# Patient Record
Sex: Male | Born: 1935
Health system: Southern US, Community
[De-identification: ages and names within clinical notes are randomized; demographics above are authoritative.]

---

## 2001-09-30 ENCOUNTER — Encounter: Payer: Self-pay | Admitting: Cardiology

## 2001-09-30 ENCOUNTER — Inpatient Hospital Stay (HOSPITAL_COMMUNITY): Admission: EM | Admit: 2001-09-30 | Discharge: 2001-10-01 | Payer: Self-pay | Admitting: Cardiology

## 2006-10-31 ENCOUNTER — Encounter: Admission: RE | Admit: 2006-10-31 | Discharge: 2006-10-31 | Payer: Self-pay | Admitting: Orthopedic Surgery

## 2007-01-15 ENCOUNTER — Inpatient Hospital Stay (HOSPITAL_COMMUNITY): Admission: RE | Admit: 2007-01-15 | Discharge: 2007-01-18 | Payer: Self-pay | Admitting: Orthopedic Surgery

## 2007-01-18 ENCOUNTER — Ambulatory Visit: Payer: Self-pay | Admitting: Vascular Surgery

## 2007-01-18 ENCOUNTER — Encounter (INDEPENDENT_AMBULATORY_CARE_PROVIDER_SITE_OTHER): Payer: Self-pay | Admitting: Orthopedic Surgery

## 2008-12-22 IMAGING — CR DG SHOULDER 1V*L*
1 series · 1 of 1 positions shown · non-contrast
Comparison: CT 10/31/06.

CLINICAL DATA: Nonunion proximal left humeral fracture.  
 PORTABLE LEFT SHOULDER ? _ VIEW:

[AP]
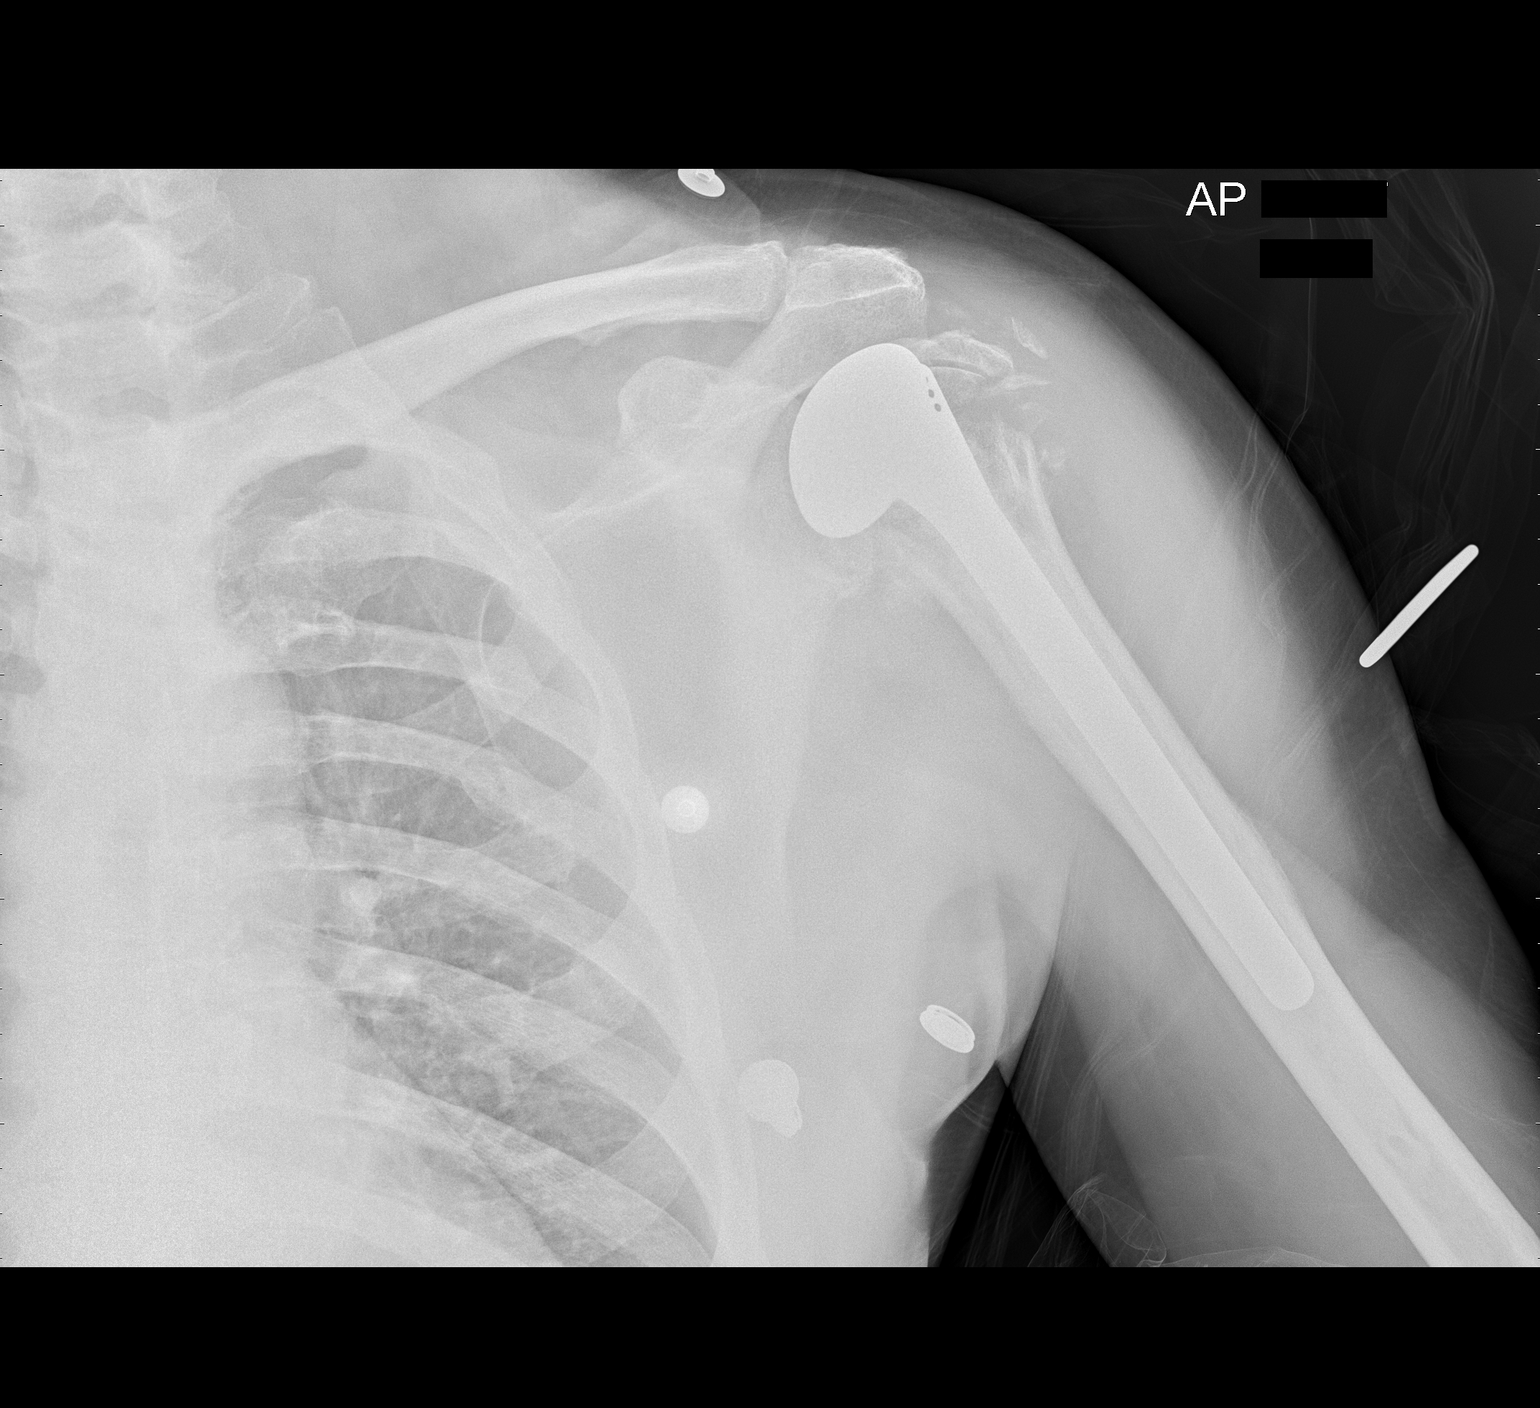

[1 of 1 positions shown; findings below may reference images not displayed]

FINDINGS: Status-post left shoulder replacement appearing in satisfactory position.  Surrounding bone fragments.  Mild acromioclavicular joint degenerative changes.
IMPRESSION: Status-post left shoulder replacement appearing in satisfactory position.

## 2010-03-20 ENCOUNTER — Encounter: Payer: Self-pay | Admitting: Orthopedic Surgery

## 2010-07-11 NOTE — Discharge Summary (Signed)
NAME:  Austin Peterson, CAMPAS NO.:  [id number]   MEDICAL RECORD NO.:  [id number]          PATIENT TYPE:  INP   LOCATION:  5020                         FACILITY:  [facility]   PHYSICIAN:  Almedia Balls. Ranell Patrick, M.D. DATE OF BIRTH:  May 03, 1926   DATE OF ADMISSION:  01/15/2007  DATE OF DISCHARGE:  01/18/2007                               DISCHARGE SUMMARY   ADMISSION DIAGNOSIS:  Left proximal humerus nonunion.   DISCHARGE DIAGNOSIS:  Left proximal humerus nonunion.   BRIEF HISTORY:  The patient is a 77 year old male who presented with a  history of a fall injuring his left shoulder with malunion of a proximal  humerus fracture.  The patient has elected to have Dr. Malon Kindle  complete an ORIF versus a hemiarthroplasty.   PROCEDURE:  The patient had a left shoulder hemiarthroplasty using the  [employer] FX system on January 15, 2007.  Attending surgeon was  Malon Kindle, MD; assistant was Standley Dakins, PA-C.  Estimated blood  loss was minimal.  No complications.   HOSPITAL COURSE:  The patient was admitted on January 15, 2007, for the  above-stated procedure which he tolerated well.  After adequate time in  the post anesthesia care unit he was transferred up to 5000.  Postoperative day #1 the patient complained about moderate pain to the  left shoulder but was able to complete activities with physical therapy  and pendulum exercises and some gentle range of motion.  Neurovascularly, he was intact.  Postoperative day #2 his dressing was  changed with some mild bleeding drainage that was stabilized after  dressing was changed.  Neurovascularly he was intact distally.  No signs  of edema.  Capillary refill was less than 2 seconds.  He was kept in a  sling.  The patient to be discharged to a short-term skilled nursing  facility on January 18, 2007.   ACTIVITY:  Nonweightbearing with left upper extremity and work with  gentle range of motion only in regards to passive range  of motion.   CONDITION:  Stable.   DIET:  Regular   The patient has allergies to CODEINE and also to STATINS.   DISCHARGE MEDICATIONS:  1. Colace 100 mg p.o. b.i.d.  2. Zetia 10 mg p.o. daily.  3. Claritin 10 mg p.o. daily.  4. Robaxin 500 mg p.o. q.6h.  5. Percocet 5 mg one to two tablets q.4-6h. pain.  6. Ambien 5 mg p.o. q.h.s.   FOLLOWUP:  The patient will follow back up with Dr. Malon Kindle in 7-  10 days and dressing change to the left shoulder for the next week and  then will see him as a followup patient in the clinic.      Thomas B. Durwin Nora, P.A.      Almedia Balls. Ranell Patrick, M.D.  Electronically Signed    TBD/MEDQ  D:  01/17/2007  T:  01/17/2007  Job:  [id number]

## 2010-07-11 NOTE — Op Note (Signed)
NAME:  MONTRAE, Austin Peterson NO.:  [id number]   MEDICAL RECORD NO.:  [id number]          PATIENT TYPE:  INP   LOCATION:  2550                         FACILITY:  [facility]   PHYSICIAN:  Almedia Balls. Ranell Patrick, M.D. DATE OF BIRTH:  1926-10-04   DATE OF PROCEDURE:  01/15/2007  DATE OF DISCHARGE:                               OPERATIVE REPORT   PREOPERATIVE DIAGNOSIS:  Left shoulder proximal humeral malunion.   POSTOPERATIVE DIAGNOSIS:  Left shoulder proximal humeral malunion.   PROCEDURE PERFORMED:  Left shoulder hemiarthroplasty and De[employer]uy [employer] FX  system.   ATTENDING SURGEON:  Almedia Balls. Ranell Patrick, M.D.   ASSISTANT:  Donnie Coffin. Durwin Nora, P.A.   General anesthesia plus interscalene block anesthesia was used.   ESTIMATED BLOOD LOSS:  Minimal.   FLUID REPLACEMENT:  1,500 mL crystalloid.   ESTIMATED BLOOD LOSS:  100 mL.   INSTRUMENT COUNTS:  Correct.   No complications.   Perioperative antibiotics were given.   INDICATION:  The patient is a 77 year old male with history of a fall  injuring his left shoulder.  The patient fell off a roof.  The patient  suffered a four part displaced proximal humerus fracture.  The patient  was treated conservatively in another community.  The patient presented  to orthopedics here in [place] with a severe malunion of the proximal  humerus with subsidence and posterior rotation of the humeral head and  tuberosities which were healed on top of the articular cartilage and  were essentially impinging in the subacromial space.  The patient had  terrible function with the following preoperative range of motion:  Forward elevation 45 degrees, abduction 30 degrees, external rotation 0  degrees, internal rotation 20 degrees.  We discussed this with the  patient.  The patient elected to proceed with surgical arthroplasty to  try to restore function and eliminate his pain.  Informed consent was  obtained.   DESCRIPTION OF THE OPERATION:  After an  adequate level of anesthesia was  achieved, the patient positioned in the modified beach-chair position.  All neurovascular structures padded appropriately.  The left shoulder  was sterilely prepped and draped in the usual manner.  We went ahead and  made a deltopectoral approach starting at the coracoid and extending  down to the anterior humeral insertion of the deltoid.  Dissection  carried sharply down through subcutaneous tissues.  We identified the  cephalic vein and took that laterally with the deltoid.  Cephalic vein  was injured during the approach and was ligated with suture ligature.  At this point, we went ahead and released a little bit of the upper  pectoralis.  This gave [place] excellent access to the conjoint tendon, which  we retracted medially.  We then identified a completely scarred interval  under the deltoid area so the deltoid had scarred completely down to the  lateral humerus to the rotator cuff.  Subscapularis actually was fairly  free of scarring.  We went ahead and released the biceps groove and  developed the left tuberosity and took a 1 inch curved osteotome and  went ahead and removed the  lesser tuberosity off of the proximal humerus  with the subscapularis.  We placed two #2 FiberWire sutures in a  mattress technique with the suture strands coming out over the top of  the lesser tuberosity.  We freed this all the way up in the rotator  interval to the scapular neck and freed it off the scapular neck off the  inferior coracoid such that it had good balance.  Next, we turned our  attention to the greater tuberosity.  We actually were able to find the  greater tuberosity healed on top of the humeral head and freed that up  off the humeral head around to the back.  We had to debulk that quite a  bit, placed a total of three #2 FiberWire sutures in a mattress fashion  lateral to the greater tuberosity with those strands coming out over the  top of the greater  tuberosity but these were full thickness bites.  We  next freed up and went ahead and removed the humeral head, which was  basically rotated 100 degrees posteriorly with curved osteotomes and  removed that.  We had to free the greater tuberosity up from underneath  the acromion and from under the deltoid and off the glenoid.  The biceps  was tenotomized and the superior labrum was debrided.  The articular  cartilage on the glenoid was normal.  We next prepared the shaft reaming  up to a size 12 reamer and then went and placed our 12 stem end with the  anterior pin adjacent to the biceps groove.  We played with the height  of that and really had to fully sink the stem down to get it the  appropriate height to where we could actually insert a 48 x 15 head.  We  trialed that, had excellent position of the head as it referenced the  glenoid.  I was able to translate it 50% posteriorly with a nice return  and then 50% inferiorly.  I went ahead and removed the trial implants.  We placed three drill holes in the humeral shaft, one in the floor of  the biceps groove and one medial and lateral to that and then brought  out the #2 FiberWire, a total of two of those with four strands through  those holes and then using DePuy 1 cement and vacuumizing technique,  went ahead and cemented the final prosthesis into place, fully seating  the prosthesis, allowing the cement to harden, then trialing with a 48 x  15 head and then placed that for real once we selected that size and  trialed it again.  We then went ahead and bone grafted using available  bone graft anteriorly and then brought our lesser and greater  tuberosities and tied those to each other.  Also took the shaft sutures  and placed those up through the subscapularis and through the rotator  cuff.  We also placed and around the world stitch medial to the left  tuberosity and lateral to the greater tuberosity.  It went through the  medial fin of  the prosthesis.  We had an excellent recreation of the  proximal humeral anatomy with tuberosity to tuberosity contact and  tuberosity to shaft contact and everything moving as a unit once we had  all the sutures tied.  We took the shoulder through a full range of  motion.  We had no significant soft tissue impingement or tightness and  excellent position of the arm on the  patient's body.  We went and  thoroughly irrigated, closed the deltopectoral approach with 0 Vicryl  suture, followed by 2-0 Vicryl subcutaneous closure and 4-0 Monocryl for  skin.  Steri-Strips applied followed by a sterile dressing and a  shoulder sling.  The patient tolerated the surgery well.      Almedia Balls. Ranell Patrick, M.D.  Electronically Signed     SRN/MEDQ  D:  01/15/2007  T:  01/15/2007  Job:  [id number]

## 2010-07-14 NOTE — Discharge Summary (Signed)
NAME:  Austin Peterson, Austin Peterson                         ACCOUNT NO.:  [id number]   MEDICAL RECORD NO.:  [id number]                   PATIENT TYPE:  INP   LOCATION:  3735                                 FACILITY:  [facility]   PHYSICIAN:  Joellyn Rued                        DATE OF BIRTH:  1927/01/18   DATE OF ADMISSION:  09/30/2001  DATE OF DISCHARGE:  10/01/2001                           DISCHARGE SUMMARY - REFERRING   HISTORY OF PRESENT ILLNESS:  The patient is a 77 year old white male who,  after cutting down approximately 25 trees, developed sudden onset of  substernal pushing pressure sensation while lying in bed watching T.V. that  evening.  He took three Tums without relief.  He felt much better by Monday  morning.  After walking from [employer] to his car, he had recurring  sensation associated with diaphoresis.   He was seen by his primary care doctor, Dr. Wende Crease, and was admitted  to [facility] where he ruled out for myocardial infarction.  His  history is notable for hyperlipidemia and family history.   ALLERGIES:  Intolerance to STATINS.   LABORATORY DATA:  At [facility], fasting lipid showed triglycerides  of 293, cholesterol of 271, LDL 162, HDL of 50.  CKs and troponins were  negative for myocardial infarction.  Sodium was 136.  Potassium 4.0, BUN 16,  creatinine 1.2.  H&H 13.8 and 38.4.  Platelets 167,000.  WBC 6.0.  At [facility], PT was 13.5.   HOSPITAL COURSE:  The patient was transferred down to [facility] on October 01, 2001.  He underwent cardiac catheterization by Dr.  Salvadore Farber.  According to his progress note, he had a very small RCA  with a proximal 70% lesion.  He also had a 20% proximal LAD and a 30%  proximal diagonal.  Ejection fraction was 71% without wall motion  abnormalities.  Postsheath removal and bed rest, he was ambulating without  difficulties.  Catheterization site was intact and it was  felt that he could  be discharged home on medical treatment.   DISCHARGE DIAGNOSES:  1. Unstable angina.  2. Coronary artery disease as previously described medical treatment.  3. Hyperlipidemia.   DISCHARGE MEDICATIONS:  1. He is discharged home on Plavix 75 mg q.d.  2. Coated aspirin 81 mg q.d.  3. Lopressor 50 mg 1/2 tablet b.i.d.  4. Naprosyn 500 mg b.i.d.  5. Nitroglycerin 0.4 mg p.r.n.   DISCHARGE INSTRUCTIONS:  He was advised no lifting, driving, sexual  activity, or heavy exertion for two days.  Maintain low salt, fat,  cholesterol diet.  If he has any problems with his catheterization site, he  will call.  He  will have a groin check with Dr. Gemma Payor P.A., Suszanne Conners. Duran,  in the [place] office on Wednesday, October 08, 2001 at 3:30 p.m.  At that time,  consideration should be given to re-evaluating his therapy for his  hyperlipidemia.  He will also arrange a follow-up appointment with Dr. Wende Crease.                                               Joellyn Rued    EW/MEDQ  D:  10/01/2001  T:  10/05/2001  Job:  [id number]   cc:   Luis Abed, M.D. [facility]  [address]  [address]  [place], [place] [address]   Wende Crease, M.D.

## 2010-07-14 NOTE — Cardiovascular Report (Signed)
   NAME:  Austin Peterson, CARNERO NO.:  [id number]   MEDICAL RECORD NO.:  [id number]                   PATIENT TYPE:  INP   LOCATION:  3735                                 FACILITY:  [facility]   PHYSICIAN:  Salvadore Farber, MD [facility]            DATE OF BIRTH:  October 10, 1926   DATE OF PROCEDURE:  10/01/2001  DATE OF DISCHARGE:                              CARDIAC CATHETERIZATION   PROCEDURES:  Coronary angiography, left ventriculography, left heart  catheterization.   INDICATIONS:  The patient is a 77 year old gentleman who presented with two  days of chest pain occurring at rest.  Electrocardiogram was normal. Cardiac  enzymes were normal. He is referred for diagnostic angiography.   DIAGNOSTIC PROCEDURE:  Under 2% lidocaine local anesthesia, a 6 French  sheath was placed in the right femoral artery using modified Seldinger  technique. The JL4 and __________ right catheters were used. A pigtail  catheter was advanced across the aortic valve into the left ventricle.  Pressures were measured. Ventriculography was performed by __________  injection. Following the procedure, the sheaths were removed.   COMPLICATIONS:  None.   FINDINGS:  1. Left ventricular pressure 134/6/14.  2. Left ventricular ejection fraction equals 71% without regional wall     motion abnormalities.  3. Left main:  Normal.  4. Circumflex:  Dominant vessel with three obtuse marginal branches and a     PDA. There is no significant disease.  5. Left anterior descending:  Moderate sized vessel with three small     diagonal branches. The proximal vessel has a long 20% stenosis.  6. Right coronary artery:  This is a small nondominant vessel.  There is an     ostial 70% stenosis with TIMI-3 flow.   IMPRESSION:  Probably acute coronary syndrome involving a small nondominant  right coronary artery. The vessel is well under 2 mm in diameter.   PLAN:  The culprit vessel is quite small and supplies a  small territory. We  will therefore plan medical therapy with aspirin, Plavix and nitrates. His  bradycardia precludes beta blockade.  Anticipate discharge to home this  afternoon.                                                 Salvadore Farber, MD [facility]    WED/MEDQ  D:  10/01/2001  T:  10/06/2001  Job:  [id number]   cc:   Jonelle Sidle, M.D. [facility]   Wende Crease, M.D.

## 2010-12-05 LAB — BASIC METABOLIC PANEL
CO2: 24
CO2: 25
Calcium: 8.5
Calcium: 9
Chloride: 102
Creatinine, Ser: 1.44
Creatinine, Ser: 1.49
GFR calc Af Amer: 55 — ABNORMAL LOW
GFR calc Af Amer: 58 — ABNORMAL LOW
GFR calc Af Amer: >60
GFR calc Af Amer: 54 — ABNORMAL LOW
GFR calc non Af Amer: 48 — ABNORMAL LOW
GFR calc non Af Amer: 51 — ABNORMAL LOW
GFR calc non Af Amer: 44 — ABNORMAL LOW
Glucose, Bld: 125 — ABNORMAL HIGH
Glucose, Bld: 177 — ABNORMAL HIGH
Potassium: 4.4
Sodium: 137
Sodium: 139

## 2010-12-05 LAB — TYPE AND SCREEN
ABO/RH(D): A POS
Antibody Screen: NEGATIVE

## 2010-12-05 LAB — DIFFERENTIAL
Basophils Absolute: 0
Basophils Relative: 0
Eosinophils Absolute: 0.1 — ABNORMAL LOW
Monocytes Absolute: 0.6
Monocytes Relative: 8
Neutro Abs: 4.3

## 2010-12-05 LAB — CBC
HCT: 42.8
Hemoglobin: 10.8 — ABNORMAL LOW
Hemoglobin: 15
MCHC: 34.6
MCHC: 34.8
MCV: 96.4
Platelets: 206
RBC: 3.23 — ABNORMAL LOW
RBC: 4.46
RDW: 13
RDW: 13.1
WBC: 7.6

## 2010-12-05 LAB — URINALYSIS, ROUTINE W REFLEX MICROSCOPIC
Hgb urine dipstick: NEGATIVE
Ketones, ur: 15 — AB
Nitrite: NEGATIVE
Specific Gravity, Urine: 1.03
pH: 5.5

## 2010-12-05 LAB — APTT: aPTT: 30

## 2010-12-05 LAB — PROTIME-INR: INR: 1.1

## 2011-05-23 DIAGNOSIS — K859 Acute pancreatitis without necrosis or infection, unspecified: Secondary | ICD-10-CM

## 2011-05-23 DIAGNOSIS — Z01810 Encounter for preprocedural cardiovascular examination: Secondary | ICD-10-CM

## 2014-09-01 ENCOUNTER — Telehealth (HOSPITAL_COMMUNITY): Payer: Self-pay | Admitting: *Deleted

## 2014-11-02 ENCOUNTER — Ambulatory Visit (HOSPITAL_COMMUNITY): Payer: Self-pay | Admitting: Psychiatry

## 2015-03-10 DIAGNOSIS — F419 Anxiety disorder, unspecified: Secondary | ICD-10-CM | POA: Diagnosis not present

## 2015-03-10 DIAGNOSIS — M545 Low back pain: Secondary | ICD-10-CM | POA: Diagnosis not present

## 2015-03-10 DIAGNOSIS — G309 Alzheimer's disease, unspecified: Secondary | ICD-10-CM | POA: Diagnosis not present

## 2015-03-10 DIAGNOSIS — J441 Chronic obstructive pulmonary disease with (acute) exacerbation: Secondary | ICD-10-CM | POA: Diagnosis not present

## 2015-04-11 DIAGNOSIS — M545 Low back pain: Secondary | ICD-10-CM | POA: Diagnosis not present

## 2015-04-11 DIAGNOSIS — J441 Chronic obstructive pulmonary disease with (acute) exacerbation: Secondary | ICD-10-CM | POA: Diagnosis not present

## 2015-04-21 DIAGNOSIS — N400 Enlarged prostate without lower urinary tract symptoms: Secondary | ICD-10-CM | POA: Diagnosis not present

## 2015-04-21 DIAGNOSIS — R300 Dysuria: Secondary | ICD-10-CM | POA: Diagnosis not present

## 2015-04-21 DIAGNOSIS — R350 Frequency of micturition: Secondary | ICD-10-CM | POA: Diagnosis not present

## 2015-05-13 DIAGNOSIS — Z710 Person encountering health services to consult on behalf of another person: Secondary | ICD-10-CM | POA: Diagnosis not present

## 2015-05-13 DIAGNOSIS — G309 Alzheimer's disease, unspecified: Secondary | ICD-10-CM | POA: Diagnosis not present

## 2015-05-16 DIAGNOSIS — E119 Type 2 diabetes mellitus without complications: Secondary | ICD-10-CM | POA: Diagnosis not present

## 2015-05-16 DIAGNOSIS — I10 Essential (primary) hypertension: Secondary | ICD-10-CM | POA: Diagnosis not present

## 2015-05-25 DIAGNOSIS — E1142 Type 2 diabetes mellitus with diabetic polyneuropathy: Secondary | ICD-10-CM | POA: Diagnosis not present

## 2015-05-25 DIAGNOSIS — I509 Heart failure, unspecified: Secondary | ICD-10-CM | POA: Diagnosis not present

## 2015-05-25 DIAGNOSIS — J441 Chronic obstructive pulmonary disease with (acute) exacerbation: Secondary | ICD-10-CM | POA: Diagnosis not present

## 2015-05-25 DIAGNOSIS — G309 Alzheimer's disease, unspecified: Secondary | ICD-10-CM | POA: Diagnosis not present

## 2015-05-25 DIAGNOSIS — Z87891 Personal history of nicotine dependence: Secondary | ICD-10-CM | POA: Diagnosis not present

## 2015-06-13 DIAGNOSIS — I509 Heart failure, unspecified: Secondary | ICD-10-CM | POA: Diagnosis not present

## 2015-06-13 DIAGNOSIS — M545 Low back pain: Secondary | ICD-10-CM | POA: Diagnosis not present

## 2015-06-13 DIAGNOSIS — G309 Alzheimer's disease, unspecified: Secondary | ICD-10-CM | POA: Diagnosis not present

## 2015-06-13 DIAGNOSIS — J441 Chronic obstructive pulmonary disease with (acute) exacerbation: Secondary | ICD-10-CM | POA: Diagnosis not present

## 2015-07-15 DIAGNOSIS — Z6828 Body mass index (BMI) 28.0-28.9, adult: Secondary | ICD-10-CM | POA: Diagnosis not present

## 2015-07-15 DIAGNOSIS — J441 Chronic obstructive pulmonary disease with (acute) exacerbation: Secondary | ICD-10-CM | POA: Diagnosis not present

## 2015-07-15 DIAGNOSIS — M545 Low back pain: Secondary | ICD-10-CM | POA: Diagnosis not present

## 2015-07-15 DIAGNOSIS — I509 Heart failure, unspecified: Secondary | ICD-10-CM | POA: Diagnosis not present

## 2015-07-15 DIAGNOSIS — G309 Alzheimer's disease, unspecified: Secondary | ICD-10-CM | POA: Diagnosis not present

## 2015-08-01 DIAGNOSIS — I10 Essential (primary) hypertension: Secondary | ICD-10-CM | POA: Diagnosis not present

## 2015-08-01 DIAGNOSIS — E119 Type 2 diabetes mellitus without complications: Secondary | ICD-10-CM | POA: Diagnosis not present

## 2015-08-11 DIAGNOSIS — J449 Chronic obstructive pulmonary disease, unspecified: Secondary | ICD-10-CM | POA: Diagnosis not present

## 2015-08-11 DIAGNOSIS — M545 Low back pain: Secondary | ICD-10-CM | POA: Diagnosis not present

## 2015-08-11 DIAGNOSIS — F419 Anxiety disorder, unspecified: Secondary | ICD-10-CM | POA: Diagnosis not present

## 2015-08-11 DIAGNOSIS — E1142 Type 2 diabetes mellitus with diabetic polyneuropathy: Secondary | ICD-10-CM | POA: Diagnosis not present

## 2015-09-15 DIAGNOSIS — M545 Low back pain: Secondary | ICD-10-CM | POA: Diagnosis not present

## 2015-09-15 DIAGNOSIS — I509 Heart failure, unspecified: Secondary | ICD-10-CM | POA: Diagnosis not present

## 2015-09-15 DIAGNOSIS — I2510 Atherosclerotic heart disease of native coronary artery without angina pectoris: Secondary | ICD-10-CM | POA: Diagnosis not present

## 2015-09-15 DIAGNOSIS — E1165 Type 2 diabetes mellitus with hyperglycemia: Secondary | ICD-10-CM | POA: Diagnosis not present

## 2015-09-15 DIAGNOSIS — Z299 Encounter for prophylactic measures, unspecified: Secondary | ICD-10-CM | POA: Diagnosis not present

## 2015-10-13 DIAGNOSIS — Z299 Encounter for prophylactic measures, unspecified: Secondary | ICD-10-CM | POA: Diagnosis not present

## 2015-10-13 DIAGNOSIS — L97504 Non-pressure chronic ulcer of other part of unspecified foot with necrosis of bone: Secondary | ICD-10-CM | POA: Diagnosis not present

## 2015-10-13 DIAGNOSIS — G309 Alzheimer's disease, unspecified: Secondary | ICD-10-CM | POA: Diagnosis not present

## 2015-10-13 DIAGNOSIS — E11621 Type 2 diabetes mellitus with foot ulcer: Secondary | ICD-10-CM | POA: Diagnosis not present

## 2015-10-13 DIAGNOSIS — M549 Dorsalgia, unspecified: Secondary | ICD-10-CM | POA: Diagnosis not present

## 2015-10-18 DIAGNOSIS — I10 Essential (primary) hypertension: Secondary | ICD-10-CM | POA: Diagnosis not present

## 2015-10-18 DIAGNOSIS — E7800 Pure hypercholesterolemia, unspecified: Secondary | ICD-10-CM | POA: Diagnosis not present

## 2015-10-18 DIAGNOSIS — E11621 Type 2 diabetes mellitus with foot ulcer: Secondary | ICD-10-CM | POA: Diagnosis not present

## 2015-10-18 DIAGNOSIS — L89620 Pressure ulcer of left heel, unstageable: Secondary | ICD-10-CM | POA: Diagnosis not present

## 2015-10-18 DIAGNOSIS — Z79899 Other long term (current) drug therapy: Secondary | ICD-10-CM | POA: Diagnosis not present

## 2015-10-18 DIAGNOSIS — Z794 Long term (current) use of insulin: Secondary | ICD-10-CM | POA: Diagnosis not present

## 2015-10-18 DIAGNOSIS — Z7982 Long term (current) use of aspirin: Secondary | ICD-10-CM | POA: Diagnosis not present

## 2015-10-18 DIAGNOSIS — L97429 Non-pressure chronic ulcer of left heel and midfoot with unspecified severity: Secondary | ICD-10-CM | POA: Diagnosis not present

## 2015-10-27 DIAGNOSIS — Z7982 Long term (current) use of aspirin: Secondary | ICD-10-CM | POA: Diagnosis not present

## 2015-10-27 DIAGNOSIS — Z885 Allergy status to narcotic agent status: Secondary | ICD-10-CM | POA: Diagnosis not present

## 2015-10-27 DIAGNOSIS — Z79899 Other long term (current) drug therapy: Secondary | ICD-10-CM | POA: Diagnosis not present

## 2015-10-27 DIAGNOSIS — Z794 Long term (current) use of insulin: Secondary | ICD-10-CM | POA: Diagnosis not present

## 2015-10-27 DIAGNOSIS — K219 Gastro-esophageal reflux disease without esophagitis: Secondary | ICD-10-CM | POA: Diagnosis not present

## 2015-10-27 DIAGNOSIS — L97422 Non-pressure chronic ulcer of left heel and midfoot with fat layer exposed: Secondary | ICD-10-CM | POA: Diagnosis not present

## 2015-10-27 DIAGNOSIS — Z888 Allergy status to other drugs, medicaments and biological substances status: Secondary | ICD-10-CM | POA: Diagnosis not present

## 2015-10-27 DIAGNOSIS — E119 Type 2 diabetes mellitus without complications: Secondary | ICD-10-CM | POA: Diagnosis not present

## 2015-10-27 DIAGNOSIS — L97429 Non-pressure chronic ulcer of left heel and midfoot with unspecified severity: Secondary | ICD-10-CM | POA: Diagnosis not present

## 2015-10-27 DIAGNOSIS — S90822A Blister (nonthermal), left foot, initial encounter: Secondary | ICD-10-CM | POA: Diagnosis not present

## 2015-10-27 DIAGNOSIS — E7800 Pure hypercholesterolemia, unspecified: Secondary | ICD-10-CM | POA: Diagnosis not present

## 2015-10-27 DIAGNOSIS — F0390 Unspecified dementia without behavioral disturbance: Secondary | ICD-10-CM | POA: Diagnosis not present

## 2015-11-03 DIAGNOSIS — E119 Type 2 diabetes mellitus without complications: Secondary | ICD-10-CM | POA: Diagnosis not present

## 2015-11-03 DIAGNOSIS — S90822A Blister (nonthermal), left foot, initial encounter: Secondary | ICD-10-CM | POA: Diagnosis not present

## 2015-11-03 DIAGNOSIS — L97422 Non-pressure chronic ulcer of left heel and midfoot with fat layer exposed: Secondary | ICD-10-CM | POA: Diagnosis not present

## 2015-11-03 DIAGNOSIS — S90822D Blister (nonthermal), left foot, subsequent encounter: Secondary | ICD-10-CM | POA: Diagnosis not present

## 2015-11-16 DIAGNOSIS — S90822A Blister (nonthermal), left foot, initial encounter: Secondary | ICD-10-CM | POA: Diagnosis not present

## 2015-11-16 DIAGNOSIS — M549 Dorsalgia, unspecified: Secondary | ICD-10-CM | POA: Diagnosis not present

## 2015-11-16 DIAGNOSIS — E119 Type 2 diabetes mellitus without complications: Secondary | ICD-10-CM | POA: Diagnosis not present

## 2015-11-16 DIAGNOSIS — I509 Heart failure, unspecified: Secondary | ICD-10-CM | POA: Diagnosis not present

## 2015-11-16 DIAGNOSIS — G309 Alzheimer's disease, unspecified: Secondary | ICD-10-CM | POA: Diagnosis not present

## 2015-11-16 DIAGNOSIS — S90822D Blister (nonthermal), left foot, subsequent encounter: Secondary | ICD-10-CM | POA: Diagnosis not present

## 2015-11-16 DIAGNOSIS — L97422 Non-pressure chronic ulcer of left heel and midfoot with fat layer exposed: Secondary | ICD-10-CM | POA: Diagnosis not present

## 2015-11-16 DIAGNOSIS — J441 Chronic obstructive pulmonary disease with (acute) exacerbation: Secondary | ICD-10-CM | POA: Diagnosis not present

## 2015-12-08 DIAGNOSIS — N400 Enlarged prostate without lower urinary tract symptoms: Secondary | ICD-10-CM | POA: Diagnosis not present

## 2015-12-08 DIAGNOSIS — Z885 Allergy status to narcotic agent status: Secondary | ICD-10-CM | POA: Diagnosis not present

## 2015-12-08 DIAGNOSIS — F0390 Unspecified dementia without behavioral disturbance: Secondary | ICD-10-CM | POA: Diagnosis not present

## 2015-12-08 DIAGNOSIS — E785 Hyperlipidemia, unspecified: Secondary | ICD-10-CM | POA: Diagnosis not present

## 2015-12-08 DIAGNOSIS — R55 Syncope and collapse: Secondary | ICD-10-CM | POA: Diagnosis not present

## 2015-12-08 DIAGNOSIS — I10 Essential (primary) hypertension: Secondary | ICD-10-CM | POA: Diagnosis not present

## 2015-12-08 DIAGNOSIS — Z7982 Long term (current) use of aspirin: Secondary | ICD-10-CM | POA: Diagnosis not present

## 2015-12-08 DIAGNOSIS — M545 Low back pain: Secondary | ICD-10-CM | POA: Diagnosis not present

## 2015-12-08 DIAGNOSIS — R42 Dizziness and giddiness: Secondary | ICD-10-CM | POA: Diagnosis not present

## 2015-12-08 DIAGNOSIS — Z720 Tobacco use: Secondary | ICD-10-CM | POA: Diagnosis not present

## 2015-12-08 DIAGNOSIS — M79643 Pain in unspecified hand: Secondary | ICD-10-CM | POA: Diagnosis not present

## 2015-12-08 DIAGNOSIS — Z888 Allergy status to other drugs, medicaments and biological substances status: Secondary | ICD-10-CM | POA: Diagnosis not present

## 2015-12-08 DIAGNOSIS — W19XXXA Unspecified fall, initial encounter: Secondary | ICD-10-CM | POA: Diagnosis not present

## 2015-12-08 DIAGNOSIS — S0990XA Unspecified injury of head, initial encounter: Secondary | ICD-10-CM | POA: Diagnosis not present

## 2015-12-08 DIAGNOSIS — M25552 Pain in left hip: Secondary | ICD-10-CM | POA: Diagnosis not present

## 2015-12-08 DIAGNOSIS — Z794 Long term (current) use of insulin: Secondary | ICD-10-CM | POA: Diagnosis not present

## 2015-12-08 DIAGNOSIS — E119 Type 2 diabetes mellitus without complications: Secondary | ICD-10-CM | POA: Diagnosis not present

## 2015-12-08 DIAGNOSIS — T148XXA Other injury of unspecified body region, initial encounter: Secondary | ICD-10-CM | POA: Diagnosis not present

## 2015-12-08 DIAGNOSIS — I951 Orthostatic hypotension: Secondary | ICD-10-CM | POA: Diagnosis not present

## 2015-12-08 DIAGNOSIS — F419 Anxiety disorder, unspecified: Secondary | ICD-10-CM | POA: Diagnosis not present

## 2015-12-08 DIAGNOSIS — Z79899 Other long term (current) drug therapy: Secondary | ICD-10-CM | POA: Diagnosis not present

## 2015-12-08 DIAGNOSIS — S199XXA Unspecified injury of neck, initial encounter: Secondary | ICD-10-CM | POA: Diagnosis not present

## 2015-12-09 DIAGNOSIS — R55 Syncope and collapse: Secondary | ICD-10-CM | POA: Diagnosis not present

## 2015-12-09 DIAGNOSIS — N400 Enlarged prostate without lower urinary tract symptoms: Secondary | ICD-10-CM | POA: Diagnosis not present

## 2015-12-09 DIAGNOSIS — E119 Type 2 diabetes mellitus without complications: Secondary | ICD-10-CM | POA: Diagnosis not present

## 2015-12-09 DIAGNOSIS — F0390 Unspecified dementia without behavioral disturbance: Secondary | ICD-10-CM | POA: Diagnosis not present

## 2015-12-10 DIAGNOSIS — F419 Anxiety disorder, unspecified: Secondary | ICD-10-CM | POA: Diagnosis not present

## 2015-12-10 DIAGNOSIS — N400 Enlarged prostate without lower urinary tract symptoms: Secondary | ICD-10-CM | POA: Diagnosis not present

## 2015-12-10 DIAGNOSIS — J449 Chronic obstructive pulmonary disease, unspecified: Secondary | ICD-10-CM | POA: Diagnosis not present

## 2015-12-10 DIAGNOSIS — M47817 Spondylosis without myelopathy or radiculopathy, lumbosacral region: Secondary | ICD-10-CM | POA: Diagnosis not present

## 2015-12-10 DIAGNOSIS — I10 Essential (primary) hypertension: Secondary | ICD-10-CM | POA: Diagnosis not present

## 2015-12-10 DIAGNOSIS — E785 Hyperlipidemia, unspecified: Secondary | ICD-10-CM | POA: Diagnosis not present

## 2015-12-10 DIAGNOSIS — L97522 Non-pressure chronic ulcer of other part of left foot with fat layer exposed: Secondary | ICD-10-CM | POA: Diagnosis not present

## 2015-12-10 DIAGNOSIS — F17210 Nicotine dependence, cigarettes, uncomplicated: Secondary | ICD-10-CM | POA: Diagnosis not present

## 2015-12-10 DIAGNOSIS — I951 Orthostatic hypotension: Secondary | ICD-10-CM | POA: Diagnosis not present

## 2015-12-10 DIAGNOSIS — E11621 Type 2 diabetes mellitus with foot ulcer: Secondary | ICD-10-CM | POA: Diagnosis not present

## 2015-12-10 DIAGNOSIS — F429 Obsessive-compulsive disorder, unspecified: Secondary | ICD-10-CM | POA: Diagnosis not present

## 2015-12-10 DIAGNOSIS — E1165 Type 2 diabetes mellitus with hyperglycemia: Secondary | ICD-10-CM | POA: Diagnosis not present

## 2015-12-10 DIAGNOSIS — F0390 Unspecified dementia without behavioral disturbance: Secondary | ICD-10-CM | POA: Diagnosis not present

## 2015-12-12 DIAGNOSIS — L97522 Non-pressure chronic ulcer of other part of left foot with fat layer exposed: Secondary | ICD-10-CM | POA: Diagnosis not present

## 2015-12-12 DIAGNOSIS — I951 Orthostatic hypotension: Secondary | ICD-10-CM | POA: Diagnosis not present

## 2015-12-12 DIAGNOSIS — J449 Chronic obstructive pulmonary disease, unspecified: Secondary | ICD-10-CM | POA: Diagnosis not present

## 2015-12-12 DIAGNOSIS — E1165 Type 2 diabetes mellitus with hyperglycemia: Secondary | ICD-10-CM | POA: Diagnosis not present

## 2015-12-12 DIAGNOSIS — E11621 Type 2 diabetes mellitus with foot ulcer: Secondary | ICD-10-CM | POA: Diagnosis not present

## 2015-12-12 DIAGNOSIS — I10 Essential (primary) hypertension: Secondary | ICD-10-CM | POA: Diagnosis not present

## 2015-12-14 DIAGNOSIS — J449 Chronic obstructive pulmonary disease, unspecified: Secondary | ICD-10-CM | POA: Diagnosis not present

## 2015-12-14 DIAGNOSIS — L97522 Non-pressure chronic ulcer of other part of left foot with fat layer exposed: Secondary | ICD-10-CM | POA: Diagnosis not present

## 2015-12-14 DIAGNOSIS — E11621 Type 2 diabetes mellitus with foot ulcer: Secondary | ICD-10-CM | POA: Diagnosis not present

## 2015-12-14 DIAGNOSIS — E1165 Type 2 diabetes mellitus with hyperglycemia: Secondary | ICD-10-CM | POA: Diagnosis not present

## 2015-12-14 DIAGNOSIS — I10 Essential (primary) hypertension: Secondary | ICD-10-CM | POA: Diagnosis not present

## 2015-12-14 DIAGNOSIS — I951 Orthostatic hypotension: Secondary | ICD-10-CM | POA: Diagnosis not present

## 2015-12-16 DIAGNOSIS — L97522 Non-pressure chronic ulcer of other part of left foot with fat layer exposed: Secondary | ICD-10-CM | POA: Diagnosis not present

## 2015-12-16 DIAGNOSIS — E1165 Type 2 diabetes mellitus with hyperglycemia: Secondary | ICD-10-CM | POA: Diagnosis not present

## 2015-12-16 DIAGNOSIS — J449 Chronic obstructive pulmonary disease, unspecified: Secondary | ICD-10-CM | POA: Diagnosis not present

## 2015-12-16 DIAGNOSIS — I951 Orthostatic hypotension: Secondary | ICD-10-CM | POA: Diagnosis not present

## 2015-12-16 DIAGNOSIS — E11621 Type 2 diabetes mellitus with foot ulcer: Secondary | ICD-10-CM | POA: Diagnosis not present

## 2015-12-16 DIAGNOSIS — I10 Essential (primary) hypertension: Secondary | ICD-10-CM | POA: Diagnosis not present

## 2015-12-19 DIAGNOSIS — I951 Orthostatic hypotension: Secondary | ICD-10-CM | POA: Diagnosis not present

## 2015-12-19 DIAGNOSIS — J449 Chronic obstructive pulmonary disease, unspecified: Secondary | ICD-10-CM | POA: Diagnosis not present

## 2015-12-19 DIAGNOSIS — E1165 Type 2 diabetes mellitus with hyperglycemia: Secondary | ICD-10-CM | POA: Diagnosis not present

## 2015-12-19 DIAGNOSIS — L97522 Non-pressure chronic ulcer of other part of left foot with fat layer exposed: Secondary | ICD-10-CM | POA: Diagnosis not present

## 2015-12-19 DIAGNOSIS — I10 Essential (primary) hypertension: Secondary | ICD-10-CM | POA: Diagnosis not present

## 2015-12-19 DIAGNOSIS — E11621 Type 2 diabetes mellitus with foot ulcer: Secondary | ICD-10-CM | POA: Diagnosis not present

## 2015-12-20 DIAGNOSIS — E11621 Type 2 diabetes mellitus with foot ulcer: Secondary | ICD-10-CM | POA: Diagnosis not present

## 2015-12-20 DIAGNOSIS — Z0000 Encounter for general adult medical examination without abnormal findings: Secondary | ICD-10-CM | POA: Diagnosis not present

## 2015-12-20 DIAGNOSIS — Z7189 Other specified counseling: Secondary | ICD-10-CM | POA: Diagnosis not present

## 2015-12-20 DIAGNOSIS — Z299 Encounter for prophylactic measures, unspecified: Secondary | ICD-10-CM | POA: Diagnosis not present

## 2015-12-20 DIAGNOSIS — R5383 Other fatigue: Secondary | ICD-10-CM | POA: Diagnosis not present

## 2015-12-20 DIAGNOSIS — E7800 Pure hypercholesterolemia, unspecified: Secondary | ICD-10-CM | POA: Diagnosis not present

## 2015-12-20 DIAGNOSIS — M545 Low back pain: Secondary | ICD-10-CM | POA: Diagnosis not present

## 2015-12-20 DIAGNOSIS — I10 Essential (primary) hypertension: Secondary | ICD-10-CM | POA: Diagnosis not present

## 2015-12-20 DIAGNOSIS — Z1389 Encounter for screening for other disorder: Secondary | ICD-10-CM | POA: Diagnosis not present

## 2015-12-20 DIAGNOSIS — L97504 Non-pressure chronic ulcer of other part of unspecified foot with necrosis of bone: Secondary | ICD-10-CM | POA: Diagnosis not present

## 2015-12-20 DIAGNOSIS — Z1211 Encounter for screening for malignant neoplasm of colon: Secondary | ICD-10-CM | POA: Diagnosis not present

## 2015-12-21 DIAGNOSIS — L97522 Non-pressure chronic ulcer of other part of left foot with fat layer exposed: Secondary | ICD-10-CM | POA: Diagnosis not present

## 2015-12-21 DIAGNOSIS — J449 Chronic obstructive pulmonary disease, unspecified: Secondary | ICD-10-CM | POA: Diagnosis not present

## 2015-12-21 DIAGNOSIS — E11621 Type 2 diabetes mellitus with foot ulcer: Secondary | ICD-10-CM | POA: Diagnosis not present

## 2015-12-21 DIAGNOSIS — E1165 Type 2 diabetes mellitus with hyperglycemia: Secondary | ICD-10-CM | POA: Diagnosis not present

## 2015-12-21 DIAGNOSIS — I10 Essential (primary) hypertension: Secondary | ICD-10-CM | POA: Diagnosis not present

## 2015-12-21 DIAGNOSIS — I951 Orthostatic hypotension: Secondary | ICD-10-CM | POA: Diagnosis not present

## 2015-12-23 DIAGNOSIS — I951 Orthostatic hypotension: Secondary | ICD-10-CM | POA: Diagnosis not present

## 2015-12-23 DIAGNOSIS — L97522 Non-pressure chronic ulcer of other part of left foot with fat layer exposed: Secondary | ICD-10-CM | POA: Diagnosis not present

## 2015-12-23 DIAGNOSIS — E11621 Type 2 diabetes mellitus with foot ulcer: Secondary | ICD-10-CM | POA: Diagnosis not present

## 2015-12-23 DIAGNOSIS — I10 Essential (primary) hypertension: Secondary | ICD-10-CM | POA: Diagnosis not present

## 2015-12-23 DIAGNOSIS — E1165 Type 2 diabetes mellitus with hyperglycemia: Secondary | ICD-10-CM | POA: Diagnosis not present

## 2015-12-23 DIAGNOSIS — J449 Chronic obstructive pulmonary disease, unspecified: Secondary | ICD-10-CM | POA: Diagnosis not present

## 2015-12-26 DIAGNOSIS — E1165 Type 2 diabetes mellitus with hyperglycemia: Secondary | ICD-10-CM | POA: Diagnosis not present

## 2015-12-26 DIAGNOSIS — J449 Chronic obstructive pulmonary disease, unspecified: Secondary | ICD-10-CM | POA: Diagnosis not present

## 2015-12-26 DIAGNOSIS — L97522 Non-pressure chronic ulcer of other part of left foot with fat layer exposed: Secondary | ICD-10-CM | POA: Diagnosis not present

## 2015-12-26 DIAGNOSIS — I10 Essential (primary) hypertension: Secondary | ICD-10-CM | POA: Diagnosis not present

## 2015-12-26 DIAGNOSIS — E11621 Type 2 diabetes mellitus with foot ulcer: Secondary | ICD-10-CM | POA: Diagnosis not present

## 2015-12-26 DIAGNOSIS — I951 Orthostatic hypotension: Secondary | ICD-10-CM | POA: Diagnosis not present

## 2015-12-28 DIAGNOSIS — E1165 Type 2 diabetes mellitus with hyperglycemia: Secondary | ICD-10-CM | POA: Diagnosis not present

## 2015-12-28 DIAGNOSIS — E11621 Type 2 diabetes mellitus with foot ulcer: Secondary | ICD-10-CM | POA: Diagnosis not present

## 2015-12-28 DIAGNOSIS — J449 Chronic obstructive pulmonary disease, unspecified: Secondary | ICD-10-CM | POA: Diagnosis not present

## 2015-12-28 DIAGNOSIS — L97522 Non-pressure chronic ulcer of other part of left foot with fat layer exposed: Secondary | ICD-10-CM | POA: Diagnosis not present

## 2015-12-28 DIAGNOSIS — I951 Orthostatic hypotension: Secondary | ICD-10-CM | POA: Diagnosis not present

## 2015-12-28 DIAGNOSIS — I10 Essential (primary) hypertension: Secondary | ICD-10-CM | POA: Diagnosis not present

## 2015-12-29 DIAGNOSIS — J449 Chronic obstructive pulmonary disease, unspecified: Secondary | ICD-10-CM | POA: Diagnosis not present

## 2015-12-29 DIAGNOSIS — L97522 Non-pressure chronic ulcer of other part of left foot with fat layer exposed: Secondary | ICD-10-CM | POA: Diagnosis not present

## 2015-12-29 DIAGNOSIS — E1165 Type 2 diabetes mellitus with hyperglycemia: Secondary | ICD-10-CM | POA: Diagnosis not present

## 2015-12-29 DIAGNOSIS — I951 Orthostatic hypotension: Secondary | ICD-10-CM | POA: Diagnosis not present

## 2015-12-29 DIAGNOSIS — E11621 Type 2 diabetes mellitus with foot ulcer: Secondary | ICD-10-CM | POA: Diagnosis not present

## 2015-12-29 DIAGNOSIS — I10 Essential (primary) hypertension: Secondary | ICD-10-CM | POA: Diagnosis not present

## 2015-12-30 DIAGNOSIS — E11621 Type 2 diabetes mellitus with foot ulcer: Secondary | ICD-10-CM | POA: Diagnosis not present

## 2015-12-30 DIAGNOSIS — I10 Essential (primary) hypertension: Secondary | ICD-10-CM | POA: Diagnosis not present

## 2015-12-30 DIAGNOSIS — I951 Orthostatic hypotension: Secondary | ICD-10-CM | POA: Diagnosis not present

## 2015-12-30 DIAGNOSIS — J449 Chronic obstructive pulmonary disease, unspecified: Secondary | ICD-10-CM | POA: Diagnosis not present

## 2015-12-30 DIAGNOSIS — E1165 Type 2 diabetes mellitus with hyperglycemia: Secondary | ICD-10-CM | POA: Diagnosis not present

## 2015-12-30 DIAGNOSIS — L97522 Non-pressure chronic ulcer of other part of left foot with fat layer exposed: Secondary | ICD-10-CM | POA: Diagnosis not present

## 2016-01-02 DIAGNOSIS — L97522 Non-pressure chronic ulcer of other part of left foot with fat layer exposed: Secondary | ICD-10-CM | POA: Diagnosis not present

## 2016-01-02 DIAGNOSIS — I951 Orthostatic hypotension: Secondary | ICD-10-CM | POA: Diagnosis not present

## 2016-01-02 DIAGNOSIS — E1165 Type 2 diabetes mellitus with hyperglycemia: Secondary | ICD-10-CM | POA: Diagnosis not present

## 2016-01-02 DIAGNOSIS — J449 Chronic obstructive pulmonary disease, unspecified: Secondary | ICD-10-CM | POA: Diagnosis not present

## 2016-01-02 DIAGNOSIS — I10 Essential (primary) hypertension: Secondary | ICD-10-CM | POA: Diagnosis not present

## 2016-01-02 DIAGNOSIS — E11621 Type 2 diabetes mellitus with foot ulcer: Secondary | ICD-10-CM | POA: Diagnosis not present

## 2016-01-03 DIAGNOSIS — L97522 Non-pressure chronic ulcer of other part of left foot with fat layer exposed: Secondary | ICD-10-CM | POA: Diagnosis not present

## 2016-01-03 DIAGNOSIS — I951 Orthostatic hypotension: Secondary | ICD-10-CM | POA: Diagnosis not present

## 2016-01-03 DIAGNOSIS — I10 Essential (primary) hypertension: Secondary | ICD-10-CM | POA: Diagnosis not present

## 2016-01-03 DIAGNOSIS — E11621 Type 2 diabetes mellitus with foot ulcer: Secondary | ICD-10-CM | POA: Diagnosis not present

## 2016-01-03 DIAGNOSIS — J449 Chronic obstructive pulmonary disease, unspecified: Secondary | ICD-10-CM | POA: Diagnosis not present

## 2016-01-03 DIAGNOSIS — E1165 Type 2 diabetes mellitus with hyperglycemia: Secondary | ICD-10-CM | POA: Diagnosis not present

## 2016-01-05 DIAGNOSIS — J449 Chronic obstructive pulmonary disease, unspecified: Secondary | ICD-10-CM | POA: Diagnosis not present

## 2016-01-05 DIAGNOSIS — I951 Orthostatic hypotension: Secondary | ICD-10-CM | POA: Diagnosis not present

## 2016-01-05 DIAGNOSIS — E1165 Type 2 diabetes mellitus with hyperglycemia: Secondary | ICD-10-CM | POA: Diagnosis not present

## 2016-01-05 DIAGNOSIS — E11621 Type 2 diabetes mellitus with foot ulcer: Secondary | ICD-10-CM | POA: Diagnosis not present

## 2016-01-05 DIAGNOSIS — L97522 Non-pressure chronic ulcer of other part of left foot with fat layer exposed: Secondary | ICD-10-CM | POA: Diagnosis not present

## 2016-01-05 DIAGNOSIS — I10 Essential (primary) hypertension: Secondary | ICD-10-CM | POA: Diagnosis not present

## 2016-01-06 DIAGNOSIS — I10 Essential (primary) hypertension: Secondary | ICD-10-CM | POA: Diagnosis not present

## 2016-01-06 DIAGNOSIS — L97522 Non-pressure chronic ulcer of other part of left foot with fat layer exposed: Secondary | ICD-10-CM | POA: Diagnosis not present

## 2016-01-06 DIAGNOSIS — E11621 Type 2 diabetes mellitus with foot ulcer: Secondary | ICD-10-CM | POA: Diagnosis not present

## 2016-01-06 DIAGNOSIS — I951 Orthostatic hypotension: Secondary | ICD-10-CM | POA: Diagnosis not present

## 2016-01-06 DIAGNOSIS — J449 Chronic obstructive pulmonary disease, unspecified: Secondary | ICD-10-CM | POA: Diagnosis not present

## 2016-01-06 DIAGNOSIS — E1165 Type 2 diabetes mellitus with hyperglycemia: Secondary | ICD-10-CM | POA: Diagnosis not present

## 2016-01-07 DIAGNOSIS — I951 Orthostatic hypotension: Secondary | ICD-10-CM | POA: Diagnosis not present

## 2016-01-07 DIAGNOSIS — E1165 Type 2 diabetes mellitus with hyperglycemia: Secondary | ICD-10-CM | POA: Diagnosis not present

## 2016-01-07 DIAGNOSIS — J449 Chronic obstructive pulmonary disease, unspecified: Secondary | ICD-10-CM | POA: Diagnosis not present

## 2016-01-07 DIAGNOSIS — L97522 Non-pressure chronic ulcer of other part of left foot with fat layer exposed: Secondary | ICD-10-CM | POA: Diagnosis not present

## 2016-01-07 DIAGNOSIS — I10 Essential (primary) hypertension: Secondary | ICD-10-CM | POA: Diagnosis not present

## 2016-01-07 DIAGNOSIS — E11621 Type 2 diabetes mellitus with foot ulcer: Secondary | ICD-10-CM | POA: Diagnosis not present

## 2016-01-09 DIAGNOSIS — I10 Essential (primary) hypertension: Secondary | ICD-10-CM | POA: Diagnosis not present

## 2016-01-09 DIAGNOSIS — L97522 Non-pressure chronic ulcer of other part of left foot with fat layer exposed: Secondary | ICD-10-CM | POA: Diagnosis not present

## 2016-01-09 DIAGNOSIS — J449 Chronic obstructive pulmonary disease, unspecified: Secondary | ICD-10-CM | POA: Diagnosis not present

## 2016-01-09 DIAGNOSIS — E11621 Type 2 diabetes mellitus with foot ulcer: Secondary | ICD-10-CM | POA: Diagnosis not present

## 2016-01-09 DIAGNOSIS — E1165 Type 2 diabetes mellitus with hyperglycemia: Secondary | ICD-10-CM | POA: Diagnosis not present

## 2016-01-09 DIAGNOSIS — I951 Orthostatic hypotension: Secondary | ICD-10-CM | POA: Diagnosis not present

## 2016-01-10 DIAGNOSIS — I10 Essential (primary) hypertension: Secondary | ICD-10-CM | POA: Diagnosis not present

## 2016-01-10 DIAGNOSIS — E1165 Type 2 diabetes mellitus with hyperglycemia: Secondary | ICD-10-CM | POA: Diagnosis not present

## 2016-01-10 DIAGNOSIS — E11621 Type 2 diabetes mellitus with foot ulcer: Secondary | ICD-10-CM | POA: Diagnosis not present

## 2016-01-10 DIAGNOSIS — L97429 Non-pressure chronic ulcer of left heel and midfoot with unspecified severity: Secondary | ICD-10-CM | POA: Diagnosis not present

## 2016-01-10 DIAGNOSIS — L97522 Non-pressure chronic ulcer of other part of left foot with fat layer exposed: Secondary | ICD-10-CM | POA: Diagnosis not present

## 2016-01-10 DIAGNOSIS — I951 Orthostatic hypotension: Secondary | ICD-10-CM | POA: Diagnosis not present

## 2016-01-10 DIAGNOSIS — L97422 Non-pressure chronic ulcer of left heel and midfoot with fat layer exposed: Secondary | ICD-10-CM | POA: Diagnosis not present

## 2016-01-10 DIAGNOSIS — J449 Chronic obstructive pulmonary disease, unspecified: Secondary | ICD-10-CM | POA: Diagnosis not present

## 2016-01-12 DIAGNOSIS — J449 Chronic obstructive pulmonary disease, unspecified: Secondary | ICD-10-CM | POA: Diagnosis not present

## 2016-01-12 DIAGNOSIS — I10 Essential (primary) hypertension: Secondary | ICD-10-CM | POA: Diagnosis not present

## 2016-01-12 DIAGNOSIS — L97522 Non-pressure chronic ulcer of other part of left foot with fat layer exposed: Secondary | ICD-10-CM | POA: Diagnosis not present

## 2016-01-12 DIAGNOSIS — E11621 Type 2 diabetes mellitus with foot ulcer: Secondary | ICD-10-CM | POA: Diagnosis not present

## 2016-01-12 DIAGNOSIS — E1165 Type 2 diabetes mellitus with hyperglycemia: Secondary | ICD-10-CM | POA: Diagnosis not present

## 2016-01-12 DIAGNOSIS — I951 Orthostatic hypotension: Secondary | ICD-10-CM | POA: Diagnosis not present

## 2016-01-16 DIAGNOSIS — E11621 Type 2 diabetes mellitus with foot ulcer: Secondary | ICD-10-CM | POA: Diagnosis not present

## 2016-01-16 DIAGNOSIS — I10 Essential (primary) hypertension: Secondary | ICD-10-CM | POA: Diagnosis not present

## 2016-01-16 DIAGNOSIS — E1165 Type 2 diabetes mellitus with hyperglycemia: Secondary | ICD-10-CM | POA: Diagnosis not present

## 2016-01-16 DIAGNOSIS — I951 Orthostatic hypotension: Secondary | ICD-10-CM | POA: Diagnosis not present

## 2016-01-16 DIAGNOSIS — J449 Chronic obstructive pulmonary disease, unspecified: Secondary | ICD-10-CM | POA: Diagnosis not present

## 2016-01-16 DIAGNOSIS — L97522 Non-pressure chronic ulcer of other part of left foot with fat layer exposed: Secondary | ICD-10-CM | POA: Diagnosis not present

## 2016-01-17 DIAGNOSIS — I951 Orthostatic hypotension: Secondary | ICD-10-CM | POA: Diagnosis not present

## 2016-01-17 DIAGNOSIS — I10 Essential (primary) hypertension: Secondary | ICD-10-CM | POA: Diagnosis not present

## 2016-01-17 DIAGNOSIS — L97522 Non-pressure chronic ulcer of other part of left foot with fat layer exposed: Secondary | ICD-10-CM | POA: Diagnosis not present

## 2016-01-17 DIAGNOSIS — J449 Chronic obstructive pulmonary disease, unspecified: Secondary | ICD-10-CM | POA: Diagnosis not present

## 2016-01-17 DIAGNOSIS — E119 Type 2 diabetes mellitus without complications: Secondary | ICD-10-CM | POA: Diagnosis not present

## 2016-01-17 DIAGNOSIS — E11621 Type 2 diabetes mellitus with foot ulcer: Secondary | ICD-10-CM | POA: Diagnosis not present

## 2016-01-17 DIAGNOSIS — E1165 Type 2 diabetes mellitus with hyperglycemia: Secondary | ICD-10-CM | POA: Diagnosis not present

## 2016-01-18 DIAGNOSIS — Z299 Encounter for prophylactic measures, unspecified: Secondary | ICD-10-CM | POA: Diagnosis not present

## 2016-01-18 DIAGNOSIS — Z23 Encounter for immunization: Secondary | ICD-10-CM | POA: Diagnosis not present

## 2016-01-18 DIAGNOSIS — I509 Heart failure, unspecified: Secondary | ICD-10-CM | POA: Diagnosis not present

## 2016-01-18 DIAGNOSIS — M545 Low back pain: Secondary | ICD-10-CM | POA: Diagnosis not present

## 2016-01-18 DIAGNOSIS — G309 Alzheimer's disease, unspecified: Secondary | ICD-10-CM | POA: Diagnosis not present

## 2016-01-18 DIAGNOSIS — Z6828 Body mass index (BMI) 28.0-28.9, adult: Secondary | ICD-10-CM | POA: Diagnosis not present

## 2016-01-18 DIAGNOSIS — J449 Chronic obstructive pulmonary disease, unspecified: Secondary | ICD-10-CM | POA: Diagnosis not present

## 2016-01-20 DIAGNOSIS — L97522 Non-pressure chronic ulcer of other part of left foot with fat layer exposed: Secondary | ICD-10-CM | POA: Diagnosis not present

## 2016-01-20 DIAGNOSIS — E11621 Type 2 diabetes mellitus with foot ulcer: Secondary | ICD-10-CM | POA: Diagnosis not present

## 2016-01-20 DIAGNOSIS — I951 Orthostatic hypotension: Secondary | ICD-10-CM | POA: Diagnosis not present

## 2016-01-20 DIAGNOSIS — J449 Chronic obstructive pulmonary disease, unspecified: Secondary | ICD-10-CM | POA: Diagnosis not present

## 2016-01-20 DIAGNOSIS — E1165 Type 2 diabetes mellitus with hyperglycemia: Secondary | ICD-10-CM | POA: Diagnosis not present

## 2016-01-20 DIAGNOSIS — I10 Essential (primary) hypertension: Secondary | ICD-10-CM | POA: Diagnosis not present

## 2016-01-24 DIAGNOSIS — L97522 Non-pressure chronic ulcer of other part of left foot with fat layer exposed: Secondary | ICD-10-CM | POA: Diagnosis not present

## 2016-01-24 DIAGNOSIS — E11621 Type 2 diabetes mellitus with foot ulcer: Secondary | ICD-10-CM | POA: Diagnosis not present

## 2016-01-24 DIAGNOSIS — J449 Chronic obstructive pulmonary disease, unspecified: Secondary | ICD-10-CM | POA: Diagnosis not present

## 2016-01-24 DIAGNOSIS — I951 Orthostatic hypotension: Secondary | ICD-10-CM | POA: Diagnosis not present

## 2016-01-24 DIAGNOSIS — I10 Essential (primary) hypertension: Secondary | ICD-10-CM | POA: Diagnosis not present

## 2016-01-24 DIAGNOSIS — E1165 Type 2 diabetes mellitus with hyperglycemia: Secondary | ICD-10-CM | POA: Diagnosis not present

## 2016-01-27 DIAGNOSIS — I10 Essential (primary) hypertension: Secondary | ICD-10-CM | POA: Diagnosis not present

## 2016-01-27 DIAGNOSIS — E1165 Type 2 diabetes mellitus with hyperglycemia: Secondary | ICD-10-CM | POA: Diagnosis not present

## 2016-01-27 DIAGNOSIS — L97522 Non-pressure chronic ulcer of other part of left foot with fat layer exposed: Secondary | ICD-10-CM | POA: Diagnosis not present

## 2016-01-27 DIAGNOSIS — E11621 Type 2 diabetes mellitus with foot ulcer: Secondary | ICD-10-CM | POA: Diagnosis not present

## 2016-01-27 DIAGNOSIS — J449 Chronic obstructive pulmonary disease, unspecified: Secondary | ICD-10-CM | POA: Diagnosis not present

## 2016-01-27 DIAGNOSIS — I951 Orthostatic hypotension: Secondary | ICD-10-CM | POA: Diagnosis not present

## 2016-01-31 DIAGNOSIS — L97522 Non-pressure chronic ulcer of other part of left foot with fat layer exposed: Secondary | ICD-10-CM | POA: Diagnosis not present

## 2016-01-31 DIAGNOSIS — E1165 Type 2 diabetes mellitus with hyperglycemia: Secondary | ICD-10-CM | POA: Diagnosis not present

## 2016-01-31 DIAGNOSIS — I10 Essential (primary) hypertension: Secondary | ICD-10-CM | POA: Diagnosis not present

## 2016-01-31 DIAGNOSIS — I951 Orthostatic hypotension: Secondary | ICD-10-CM | POA: Diagnosis not present

## 2016-01-31 DIAGNOSIS — J449 Chronic obstructive pulmonary disease, unspecified: Secondary | ICD-10-CM | POA: Diagnosis not present

## 2016-01-31 DIAGNOSIS — E11621 Type 2 diabetes mellitus with foot ulcer: Secondary | ICD-10-CM | POA: Diagnosis not present

## 2016-02-01 DIAGNOSIS — L89623 Pressure ulcer of left heel, stage 3: Secondary | ICD-10-CM | POA: Diagnosis not present

## 2016-02-07 DIAGNOSIS — E11621 Type 2 diabetes mellitus with foot ulcer: Secondary | ICD-10-CM | POA: Diagnosis not present

## 2016-02-07 DIAGNOSIS — L97522 Non-pressure chronic ulcer of other part of left foot with fat layer exposed: Secondary | ICD-10-CM | POA: Diagnosis not present

## 2016-02-07 DIAGNOSIS — E1165 Type 2 diabetes mellitus with hyperglycemia: Secondary | ICD-10-CM | POA: Diagnosis not present

## 2016-02-07 DIAGNOSIS — J449 Chronic obstructive pulmonary disease, unspecified: Secondary | ICD-10-CM | POA: Diagnosis not present

## 2016-02-07 DIAGNOSIS — I10 Essential (primary) hypertension: Secondary | ICD-10-CM | POA: Diagnosis not present

## 2016-02-07 DIAGNOSIS — I951 Orthostatic hypotension: Secondary | ICD-10-CM | POA: Diagnosis not present

## 2016-02-17 DIAGNOSIS — Z299 Encounter for prophylactic measures, unspecified: Secondary | ICD-10-CM | POA: Diagnosis not present

## 2016-02-17 DIAGNOSIS — Z6828 Body mass index (BMI) 28.0-28.9, adult: Secondary | ICD-10-CM | POA: Diagnosis not present

## 2016-02-17 DIAGNOSIS — I509 Heart failure, unspecified: Secondary | ICD-10-CM | POA: Diagnosis not present

## 2016-02-17 DIAGNOSIS — F419 Anxiety disorder, unspecified: Secondary | ICD-10-CM | POA: Diagnosis not present

## 2016-02-17 DIAGNOSIS — E1142 Type 2 diabetes mellitus with diabetic polyneuropathy: Secondary | ICD-10-CM | POA: Diagnosis not present

## 2016-02-23 DIAGNOSIS — I10 Essential (primary) hypertension: Secondary | ICD-10-CM | POA: Diagnosis not present

## 2016-02-23 DIAGNOSIS — E119 Type 2 diabetes mellitus without complications: Secondary | ICD-10-CM | POA: Diagnosis not present

## 2016-03-27 DIAGNOSIS — Z713 Dietary counseling and surveillance: Secondary | ICD-10-CM | POA: Diagnosis not present

## 2016-03-27 DIAGNOSIS — Z299 Encounter for prophylactic measures, unspecified: Secondary | ICD-10-CM | POA: Diagnosis not present

## 2016-03-27 DIAGNOSIS — I509 Heart failure, unspecified: Secondary | ICD-10-CM | POA: Diagnosis not present

## 2016-03-27 DIAGNOSIS — E1165 Type 2 diabetes mellitus with hyperglycemia: Secondary | ICD-10-CM | POA: Diagnosis not present

## 2016-03-27 DIAGNOSIS — G309 Alzheimer's disease, unspecified: Secondary | ICD-10-CM | POA: Diagnosis not present

## 2016-03-27 DIAGNOSIS — M545 Low back pain: Secondary | ICD-10-CM | POA: Diagnosis not present

## 2016-03-27 DIAGNOSIS — J449 Chronic obstructive pulmonary disease, unspecified: Secondary | ICD-10-CM | POA: Diagnosis not present

## 2016-03-27 DIAGNOSIS — Z6827 Body mass index (BMI) 27.0-27.9, adult: Secondary | ICD-10-CM | POA: Diagnosis not present

## 2016-05-10 DIAGNOSIS — L89623 Pressure ulcer of left heel, stage 3: Secondary | ICD-10-CM | POA: Diagnosis not present

## 2016-05-10 DIAGNOSIS — L89624 Pressure ulcer of left heel, stage 4: Secondary | ICD-10-CM | POA: Diagnosis not present

## 2016-05-23 DIAGNOSIS — Z299 Encounter for prophylactic measures, unspecified: Secondary | ICD-10-CM | POA: Diagnosis not present

## 2016-05-23 DIAGNOSIS — N400 Enlarged prostate without lower urinary tract symptoms: Secondary | ICD-10-CM | POA: Diagnosis not present

## 2016-05-23 DIAGNOSIS — F419 Anxiety disorder, unspecified: Secondary | ICD-10-CM | POA: Diagnosis not present

## 2016-05-23 DIAGNOSIS — Z713 Dietary counseling and surveillance: Secondary | ICD-10-CM | POA: Diagnosis not present

## 2016-05-23 DIAGNOSIS — E1142 Type 2 diabetes mellitus with diabetic polyneuropathy: Secondary | ICD-10-CM | POA: Diagnosis not present

## 2016-05-23 DIAGNOSIS — F0280 Dementia in other diseases classified elsewhere without behavioral disturbance: Secondary | ICD-10-CM | POA: Diagnosis not present

## 2016-05-23 DIAGNOSIS — I10 Essential (primary) hypertension: Secondary | ICD-10-CM | POA: Diagnosis not present

## 2016-05-23 DIAGNOSIS — G309 Alzheimer's disease, unspecified: Secondary | ICD-10-CM | POA: Diagnosis not present

## 2016-05-23 DIAGNOSIS — M545 Low back pain: Secondary | ICD-10-CM | POA: Diagnosis not present

## 2016-05-23 DIAGNOSIS — L97504 Non-pressure chronic ulcer of other part of unspecified foot with necrosis of bone: Secondary | ICD-10-CM | POA: Diagnosis not present

## 2016-05-23 DIAGNOSIS — J449 Chronic obstructive pulmonary disease, unspecified: Secondary | ICD-10-CM | POA: Diagnosis not present

## 2016-05-23 DIAGNOSIS — E11621 Type 2 diabetes mellitus with foot ulcer: Secondary | ICD-10-CM | POA: Diagnosis not present

## 2016-06-08 DIAGNOSIS — E119 Type 2 diabetes mellitus without complications: Secondary | ICD-10-CM | POA: Diagnosis not present

## 2016-06-08 DIAGNOSIS — I10 Essential (primary) hypertension: Secondary | ICD-10-CM | POA: Diagnosis not present

## 2016-07-11 DIAGNOSIS — Z87891 Personal history of nicotine dependence: Secondary | ICD-10-CM | POA: Diagnosis not present

## 2016-07-11 DIAGNOSIS — I10 Essential (primary) hypertension: Secondary | ICD-10-CM | POA: Diagnosis not present

## 2016-07-11 DIAGNOSIS — Z7982 Long term (current) use of aspirin: Secondary | ICD-10-CM | POA: Diagnosis not present

## 2016-07-11 DIAGNOSIS — Z7984 Long term (current) use of oral hypoglycemic drugs: Secondary | ICD-10-CM | POA: Diagnosis not present

## 2016-07-11 DIAGNOSIS — E119 Type 2 diabetes mellitus without complications: Secondary | ICD-10-CM | POA: Diagnosis not present

## 2016-07-11 DIAGNOSIS — Z79899 Other long term (current) drug therapy: Secondary | ICD-10-CM | POA: Diagnosis not present

## 2016-07-11 DIAGNOSIS — S91114A Laceration without foreign body of right lesser toe(s) without damage to nail, initial encounter: Secondary | ICD-10-CM | POA: Diagnosis not present

## 2016-07-11 DIAGNOSIS — S90812A Abrasion, left foot, initial encounter: Secondary | ICD-10-CM | POA: Diagnosis not present

## 2016-07-11 DIAGNOSIS — S91115A Laceration without foreign body of left lesser toe(s) without damage to nail, initial encounter: Secondary | ICD-10-CM | POA: Diagnosis not present

## 2016-07-11 DIAGNOSIS — E7800 Pure hypercholesterolemia, unspecified: Secondary | ICD-10-CM | POA: Diagnosis not present

## 2016-07-20 DIAGNOSIS — E1165 Type 2 diabetes mellitus with hyperglycemia: Secondary | ICD-10-CM | POA: Diagnosis not present

## 2016-07-20 DIAGNOSIS — E663 Overweight: Secondary | ICD-10-CM | POA: Diagnosis not present

## 2016-07-20 DIAGNOSIS — L97509 Non-pressure chronic ulcer of other part of unspecified foot with unspecified severity: Secondary | ICD-10-CM | POA: Diagnosis not present

## 2016-07-20 DIAGNOSIS — Z299 Encounter for prophylactic measures, unspecified: Secondary | ICD-10-CM | POA: Diagnosis not present

## 2016-07-20 DIAGNOSIS — I509 Heart failure, unspecified: Secondary | ICD-10-CM | POA: Diagnosis not present

## 2016-07-20 DIAGNOSIS — E11621 Type 2 diabetes mellitus with foot ulcer: Secondary | ICD-10-CM | POA: Diagnosis not present

## 2016-07-20 DIAGNOSIS — J449 Chronic obstructive pulmonary disease, unspecified: Secondary | ICD-10-CM | POA: Diagnosis not present

## 2016-07-20 DIAGNOSIS — K219 Gastro-esophageal reflux disease without esophagitis: Secondary | ICD-10-CM | POA: Diagnosis not present

## 2016-07-20 DIAGNOSIS — M159 Polyosteoarthritis, unspecified: Secondary | ICD-10-CM | POA: Diagnosis not present

## 2016-07-20 DIAGNOSIS — Z87891 Personal history of nicotine dependence: Secondary | ICD-10-CM | POA: Diagnosis not present

## 2016-07-20 DIAGNOSIS — I10 Essential (primary) hypertension: Secondary | ICD-10-CM | POA: Diagnosis not present

## 2016-07-20 DIAGNOSIS — E7800 Pure hypercholesterolemia, unspecified: Secondary | ICD-10-CM | POA: Diagnosis not present

## 2016-07-25 DIAGNOSIS — M79671 Pain in right foot: Secondary | ICD-10-CM | POA: Diagnosis not present

## 2016-07-25 DIAGNOSIS — J45909 Unspecified asthma, uncomplicated: Secondary | ICD-10-CM | POA: Diagnosis not present

## 2016-07-25 DIAGNOSIS — E119 Type 2 diabetes mellitus without complications: Secondary | ICD-10-CM | POA: Diagnosis not present

## 2016-07-25 DIAGNOSIS — E7800 Pure hypercholesterolemia, unspecified: Secondary | ICD-10-CM | POA: Diagnosis not present

## 2016-07-25 DIAGNOSIS — Z794 Long term (current) use of insulin: Secondary | ICD-10-CM | POA: Diagnosis not present

## 2016-07-25 DIAGNOSIS — I10 Essential (primary) hypertension: Secondary | ICD-10-CM | POA: Diagnosis not present

## 2016-07-25 DIAGNOSIS — Z79899 Other long term (current) drug therapy: Secondary | ICD-10-CM | POA: Diagnosis not present

## 2016-07-25 DIAGNOSIS — R9431 Abnormal electrocardiogram [ECG] [EKG]: Secondary | ICD-10-CM | POA: Diagnosis not present

## 2016-07-25 DIAGNOSIS — Z7982 Long term (current) use of aspirin: Secondary | ICD-10-CM | POA: Diagnosis not present

## 2016-07-25 DIAGNOSIS — R531 Weakness: Secondary | ICD-10-CM | POA: Diagnosis not present

## 2016-07-25 DIAGNOSIS — R269 Unspecified abnormalities of gait and mobility: Secondary | ICD-10-CM | POA: Diagnosis not present

## 2016-07-25 DIAGNOSIS — I519 Heart disease, unspecified: Secondary | ICD-10-CM | POA: Diagnosis not present

## 2016-07-25 DIAGNOSIS — Z87891 Personal history of nicotine dependence: Secondary | ICD-10-CM | POA: Diagnosis not present

## 2016-07-25 DIAGNOSIS — I6789 Other cerebrovascular disease: Secondary | ICD-10-CM | POA: Diagnosis not present

## 2016-07-25 DIAGNOSIS — M79672 Pain in left foot: Secondary | ICD-10-CM | POA: Diagnosis not present

## 2016-07-26 DIAGNOSIS — J45909 Unspecified asthma, uncomplicated: Secondary | ICD-10-CM | POA: Diagnosis not present

## 2016-07-26 DIAGNOSIS — M6281 Muscle weakness (generalized): Secondary | ICD-10-CM | POA: Diagnosis not present

## 2016-07-26 DIAGNOSIS — L97411 Non-pressure chronic ulcer of right heel and midfoot limited to breakdown of skin: Secondary | ICD-10-CM | POA: Diagnosis not present

## 2016-07-26 DIAGNOSIS — Z7982 Long term (current) use of aspirin: Secondary | ICD-10-CM | POA: Diagnosis not present

## 2016-07-26 DIAGNOSIS — F0390 Unspecified dementia without behavioral disturbance: Secondary | ICD-10-CM | POA: Diagnosis not present

## 2016-07-26 DIAGNOSIS — Z87891 Personal history of nicotine dependence: Secondary | ICD-10-CM | POA: Diagnosis not present

## 2016-07-26 DIAGNOSIS — M545 Low back pain: Secondary | ICD-10-CM | POA: Diagnosis not present

## 2016-07-26 DIAGNOSIS — I10 Essential (primary) hypertension: Secondary | ICD-10-CM | POA: Diagnosis not present

## 2016-07-26 DIAGNOSIS — E11621 Type 2 diabetes mellitus with foot ulcer: Secondary | ICD-10-CM | POA: Diagnosis not present

## 2016-07-26 DIAGNOSIS — Z794 Long term (current) use of insulin: Secondary | ICD-10-CM | POA: Diagnosis not present

## 2016-07-26 DIAGNOSIS — E7800 Pure hypercholesterolemia, unspecified: Secondary | ICD-10-CM | POA: Diagnosis not present

## 2016-07-26 DIAGNOSIS — L97421 Non-pressure chronic ulcer of left heel and midfoot limited to breakdown of skin: Secondary | ICD-10-CM | POA: Diagnosis not present

## 2016-07-31 DIAGNOSIS — M6281 Muscle weakness (generalized): Secondary | ICD-10-CM | POA: Diagnosis not present

## 2016-07-31 DIAGNOSIS — I10 Essential (primary) hypertension: Secondary | ICD-10-CM | POA: Diagnosis not present

## 2016-07-31 DIAGNOSIS — E11621 Type 2 diabetes mellitus with foot ulcer: Secondary | ICD-10-CM | POA: Diagnosis not present

## 2016-07-31 DIAGNOSIS — F0390 Unspecified dementia without behavioral disturbance: Secondary | ICD-10-CM | POA: Diagnosis not present

## 2016-07-31 DIAGNOSIS — L97411 Non-pressure chronic ulcer of right heel and midfoot limited to breakdown of skin: Secondary | ICD-10-CM | POA: Diagnosis not present

## 2016-07-31 DIAGNOSIS — L97421 Non-pressure chronic ulcer of left heel and midfoot limited to breakdown of skin: Secondary | ICD-10-CM | POA: Diagnosis not present

## 2016-08-01 DIAGNOSIS — E119 Type 2 diabetes mellitus without complications: Secondary | ICD-10-CM | POA: Diagnosis not present

## 2016-08-01 DIAGNOSIS — L89621 Pressure ulcer of left heel, stage 1: Secondary | ICD-10-CM | POA: Diagnosis not present

## 2016-08-01 DIAGNOSIS — S91311A Laceration without foreign body, right foot, initial encounter: Secondary | ICD-10-CM | POA: Diagnosis not present

## 2016-08-01 DIAGNOSIS — L89611 Pressure ulcer of right heel, stage 1: Secondary | ICD-10-CM | POA: Diagnosis not present

## 2016-08-03 DIAGNOSIS — M6281 Muscle weakness (generalized): Secondary | ICD-10-CM | POA: Diagnosis not present

## 2016-08-03 DIAGNOSIS — L97421 Non-pressure chronic ulcer of left heel and midfoot limited to breakdown of skin: Secondary | ICD-10-CM | POA: Diagnosis not present

## 2016-08-03 DIAGNOSIS — L97411 Non-pressure chronic ulcer of right heel and midfoot limited to breakdown of skin: Secondary | ICD-10-CM | POA: Diagnosis not present

## 2016-08-03 DIAGNOSIS — I10 Essential (primary) hypertension: Secondary | ICD-10-CM | POA: Diagnosis not present

## 2016-08-03 DIAGNOSIS — F0390 Unspecified dementia without behavioral disturbance: Secondary | ICD-10-CM | POA: Diagnosis not present

## 2016-08-03 DIAGNOSIS — E11621 Type 2 diabetes mellitus with foot ulcer: Secondary | ICD-10-CM | POA: Diagnosis not present

## 2016-08-07 DIAGNOSIS — R404 Transient alteration of awareness: Secondary | ICD-10-CM | POA: Diagnosis not present

## 2016-08-07 DIAGNOSIS — R7309 Other abnormal glucose: Secondary | ICD-10-CM | POA: Diagnosis not present

## 2016-08-07 DIAGNOSIS — R4182 Altered mental status, unspecified: Secondary | ICD-10-CM | POA: Diagnosis not present

## 2016-08-07 DIAGNOSIS — E162 Hypoglycemia, unspecified: Secondary | ICD-10-CM | POA: Diagnosis not present

## 2016-08-07 DIAGNOSIS — M1990 Unspecified osteoarthritis, unspecified site: Secondary | ICD-10-CM | POA: Diagnosis not present

## 2016-08-07 DIAGNOSIS — E11649 Type 2 diabetes mellitus with hypoglycemia without coma: Secondary | ICD-10-CM | POA: Diagnosis not present

## 2016-08-07 DIAGNOSIS — G309 Alzheimer's disease, unspecified: Secondary | ICD-10-CM | POA: Diagnosis not present

## 2016-08-07 DIAGNOSIS — F0280 Dementia in other diseases classified elsewhere without behavioral disturbance: Secondary | ICD-10-CM | POA: Diagnosis not present

## 2016-08-07 DIAGNOSIS — G9341 Metabolic encephalopathy: Secondary | ICD-10-CM | POA: Diagnosis not present

## 2016-08-07 DIAGNOSIS — N400 Enlarged prostate without lower urinary tract symptoms: Secondary | ICD-10-CM | POA: Diagnosis not present

## 2016-08-07 DIAGNOSIS — R410 Disorientation, unspecified: Secondary | ICD-10-CM | POA: Diagnosis not present

## 2016-08-08 DIAGNOSIS — E11649 Type 2 diabetes mellitus with hypoglycemia without coma: Secondary | ICD-10-CM | POA: Diagnosis not present

## 2016-08-08 DIAGNOSIS — G9341 Metabolic encephalopathy: Secondary | ICD-10-CM | POA: Diagnosis not present

## 2016-08-08 DIAGNOSIS — G309 Alzheimer's disease, unspecified: Secondary | ICD-10-CM | POA: Diagnosis not present

## 2016-08-08 DIAGNOSIS — R410 Disorientation, unspecified: Secondary | ICD-10-CM | POA: Diagnosis not present

## 2016-08-09 DIAGNOSIS — G309 Alzheimer's disease, unspecified: Secondary | ICD-10-CM | POA: Diagnosis not present

## 2016-08-09 DIAGNOSIS — G9341 Metabolic encephalopathy: Secondary | ICD-10-CM | POA: Diagnosis not present

## 2016-08-09 DIAGNOSIS — E11649 Type 2 diabetes mellitus with hypoglycemia without coma: Secondary | ICD-10-CM | POA: Diagnosis not present

## 2016-08-09 DIAGNOSIS — R410 Disorientation, unspecified: Secondary | ICD-10-CM | POA: Diagnosis not present

## 2016-08-10 DIAGNOSIS — S91302D Unspecified open wound, left foot, subsequent encounter: Secondary | ICD-10-CM | POA: Diagnosis not present

## 2016-08-10 DIAGNOSIS — N400 Enlarged prostate without lower urinary tract symptoms: Secondary | ICD-10-CM | POA: Diagnosis present

## 2016-08-10 DIAGNOSIS — G9341 Metabolic encephalopathy: Secondary | ICD-10-CM | POA: Diagnosis present

## 2016-08-10 DIAGNOSIS — R410 Disorientation, unspecified: Secondary | ICD-10-CM | POA: Diagnosis not present

## 2016-08-10 DIAGNOSIS — N401 Enlarged prostate with lower urinary tract symptoms: Secondary | ICD-10-CM | POA: Diagnosis not present

## 2016-08-10 DIAGNOSIS — J449 Chronic obstructive pulmonary disease, unspecified: Secondary | ICD-10-CM | POA: Diagnosis present

## 2016-08-10 DIAGNOSIS — E11649 Type 2 diabetes mellitus with hypoglycemia without coma: Secondary | ICD-10-CM | POA: Diagnosis not present

## 2016-08-10 DIAGNOSIS — G309 Alzheimer's disease, unspecified: Secondary | ICD-10-CM | POA: Diagnosis present

## 2016-08-10 DIAGNOSIS — R4587 Impulsiveness: Secondary | ICD-10-CM | POA: Diagnosis not present

## 2016-08-10 DIAGNOSIS — R269 Unspecified abnormalities of gait and mobility: Secondary | ICD-10-CM | POA: Diagnosis not present

## 2016-08-10 DIAGNOSIS — Z794 Long term (current) use of insulin: Secondary | ICD-10-CM | POA: Diagnosis not present

## 2016-08-10 DIAGNOSIS — Z7982 Long term (current) use of aspirin: Secondary | ICD-10-CM | POA: Diagnosis not present

## 2016-08-10 DIAGNOSIS — I10 Essential (primary) hypertension: Secondary | ICD-10-CM | POA: Diagnosis present

## 2016-08-10 DIAGNOSIS — E7800 Pure hypercholesterolemia, unspecified: Secondary | ICD-10-CM | POA: Diagnosis present

## 2016-08-10 DIAGNOSIS — R41841 Cognitive communication deficit: Secondary | ICD-10-CM | POA: Diagnosis not present

## 2016-08-10 DIAGNOSIS — Z79891 Long term (current) use of opiate analgesic: Secondary | ICD-10-CM | POA: Diagnosis not present

## 2016-08-10 DIAGNOSIS — Z79899 Other long term (current) drug therapy: Secondary | ICD-10-CM | POA: Diagnosis not present

## 2016-08-10 DIAGNOSIS — R4182 Altered mental status, unspecified: Secondary | ICD-10-CM | POA: Diagnosis not present

## 2016-08-10 DIAGNOSIS — F0280 Dementia in other diseases classified elsewhere without behavioral disturbance: Secondary | ICD-10-CM | POA: Diagnosis present

## 2016-08-10 DIAGNOSIS — F339 Major depressive disorder, recurrent, unspecified: Secondary | ICD-10-CM | POA: Diagnosis not present

## 2016-08-10 DIAGNOSIS — F429 Obsessive-compulsive disorder, unspecified: Secondary | ICD-10-CM | POA: Diagnosis present

## 2016-08-10 DIAGNOSIS — M1990 Unspecified osteoarthritis, unspecified site: Secondary | ICD-10-CM | POA: Diagnosis present

## 2016-08-10 DIAGNOSIS — E785 Hyperlipidemia, unspecified: Secondary | ICD-10-CM | POA: Diagnosis present

## 2016-08-10 DIAGNOSIS — Z8249 Family history of ischemic heart disease and other diseases of the circulatory system: Secondary | ICD-10-CM | POA: Diagnosis not present

## 2016-08-10 DIAGNOSIS — F17200 Nicotine dependence, unspecified, uncomplicated: Secondary | ICD-10-CM | POA: Diagnosis present

## 2016-08-10 DIAGNOSIS — F0390 Unspecified dementia without behavioral disturbance: Secondary | ICD-10-CM | POA: Diagnosis not present

## 2016-08-10 DIAGNOSIS — R2681 Unsteadiness on feet: Secondary | ICD-10-CM | POA: Diagnosis not present

## 2016-08-14 DIAGNOSIS — S91311A Laceration without foreign body, right foot, initial encounter: Secondary | ICD-10-CM | POA: Diagnosis not present

## 2016-08-14 DIAGNOSIS — M7989 Other specified soft tissue disorders: Secondary | ICD-10-CM | POA: Diagnosis not present

## 2016-08-14 DIAGNOSIS — F429 Obsessive-compulsive disorder, unspecified: Secondary | ICD-10-CM | POA: Diagnosis not present

## 2016-08-14 DIAGNOSIS — L97511 Non-pressure chronic ulcer of other part of right foot limited to breakdown of skin: Secondary | ICD-10-CM | POA: Diagnosis not present

## 2016-08-14 DIAGNOSIS — S91302D Unspecified open wound, left foot, subsequent encounter: Secondary | ICD-10-CM | POA: Diagnosis not present

## 2016-08-14 DIAGNOSIS — E119 Type 2 diabetes mellitus without complications: Secondary | ICD-10-CM | POA: Diagnosis not present

## 2016-08-14 DIAGNOSIS — R269 Unspecified abnormalities of gait and mobility: Secondary | ICD-10-CM | POA: Diagnosis not present

## 2016-08-14 DIAGNOSIS — E11621 Type 2 diabetes mellitus with foot ulcer: Secondary | ICD-10-CM | POA: Diagnosis not present

## 2016-08-14 DIAGNOSIS — M79675 Pain in left toe(s): Secondary | ICD-10-CM | POA: Diagnosis not present

## 2016-08-14 DIAGNOSIS — L97519 Non-pressure chronic ulcer of other part of right foot with unspecified severity: Secondary | ICD-10-CM | POA: Diagnosis not present

## 2016-08-14 DIAGNOSIS — Z79899 Other long term (current) drug therapy: Secondary | ICD-10-CM | POA: Diagnosis not present

## 2016-08-14 DIAGNOSIS — F0390 Unspecified dementia without behavioral disturbance: Secondary | ICD-10-CM | POA: Diagnosis not present

## 2016-08-14 DIAGNOSIS — R4587 Impulsiveness: Secondary | ICD-10-CM | POA: Diagnosis not present

## 2016-08-14 DIAGNOSIS — M1990 Unspecified osteoarthritis, unspecified site: Secondary | ICD-10-CM | POA: Diagnosis not present

## 2016-08-14 DIAGNOSIS — B351 Tinea unguium: Secondary | ICD-10-CM | POA: Diagnosis not present

## 2016-08-14 DIAGNOSIS — R2681 Unsteadiness on feet: Secondary | ICD-10-CM | POA: Diagnosis not present

## 2016-08-14 DIAGNOSIS — L89621 Pressure ulcer of left heel, stage 1: Secondary | ICD-10-CM | POA: Diagnosis not present

## 2016-08-14 DIAGNOSIS — Z8631 Personal history of diabetic foot ulcer: Secondary | ICD-10-CM | POA: Diagnosis not present

## 2016-08-14 DIAGNOSIS — I10 Essential (primary) hypertension: Secondary | ICD-10-CM | POA: Diagnosis not present

## 2016-08-14 DIAGNOSIS — R41841 Cognitive communication deficit: Secondary | ICD-10-CM | POA: Diagnosis not present

## 2016-08-14 DIAGNOSIS — E785 Hyperlipidemia, unspecified: Secondary | ICD-10-CM | POA: Diagnosis not present

## 2016-08-14 DIAGNOSIS — Z87891 Personal history of nicotine dependence: Secondary | ICD-10-CM | POA: Diagnosis not present

## 2016-08-14 DIAGNOSIS — F339 Major depressive disorder, recurrent, unspecified: Secondary | ICD-10-CM | POA: Diagnosis not present

## 2016-08-14 DIAGNOSIS — L538 Other specified erythematous conditions: Secondary | ICD-10-CM | POA: Diagnosis not present

## 2016-08-14 DIAGNOSIS — E11649 Type 2 diabetes mellitus with hypoglycemia without coma: Secondary | ICD-10-CM | POA: Diagnosis not present

## 2016-08-14 DIAGNOSIS — F17200 Nicotine dependence, unspecified, uncomplicated: Secondary | ICD-10-CM | POA: Diagnosis not present

## 2016-08-14 DIAGNOSIS — N401 Enlarged prostate with lower urinary tract symptoms: Secondary | ICD-10-CM | POA: Diagnosis not present

## 2016-08-14 DIAGNOSIS — S91104A Unspecified open wound of right lesser toe(s) without damage to nail, initial encounter: Secondary | ICD-10-CM | POA: Diagnosis not present

## 2016-08-14 DIAGNOSIS — Z79891 Long term (current) use of opiate analgesic: Secondary | ICD-10-CM | POA: Diagnosis not present

## 2016-08-14 DIAGNOSIS — R4182 Altered mental status, unspecified: Secondary | ICD-10-CM | POA: Diagnosis not present

## 2016-08-14 DIAGNOSIS — Z09 Encounter for follow-up examination after completed treatment for conditions other than malignant neoplasm: Secondary | ICD-10-CM | POA: Diagnosis not present

## 2016-08-14 DIAGNOSIS — L89611 Pressure ulcer of right heel, stage 1: Secondary | ICD-10-CM | POA: Diagnosis not present

## 2016-08-14 DIAGNOSIS — S90211A Contusion of right great toe with damage to nail, initial encounter: Secondary | ICD-10-CM | POA: Diagnosis not present

## 2016-08-14 DIAGNOSIS — S91311D Laceration without foreign body, right foot, subsequent encounter: Secondary | ICD-10-CM | POA: Diagnosis not present

## 2016-08-14 DIAGNOSIS — J449 Chronic obstructive pulmonary disease, unspecified: Secondary | ICD-10-CM | POA: Diagnosis not present

## 2016-08-14 DIAGNOSIS — S91302A Unspecified open wound, left foot, initial encounter: Secondary | ICD-10-CM | POA: Diagnosis not present

## 2016-08-15 DIAGNOSIS — F0390 Unspecified dementia without behavioral disturbance: Secondary | ICD-10-CM | POA: Diagnosis not present

## 2016-08-15 DIAGNOSIS — J449 Chronic obstructive pulmonary disease, unspecified: Secondary | ICD-10-CM | POA: Diagnosis not present

## 2016-08-15 DIAGNOSIS — M1990 Unspecified osteoarthritis, unspecified site: Secondary | ICD-10-CM | POA: Diagnosis not present

## 2016-08-15 DIAGNOSIS — N401 Enlarged prostate with lower urinary tract symptoms: Secondary | ICD-10-CM | POA: Diagnosis not present

## 2016-08-16 DIAGNOSIS — Z79899 Other long term (current) drug therapy: Secondary | ICD-10-CM | POA: Diagnosis not present

## 2016-08-16 DIAGNOSIS — E119 Type 2 diabetes mellitus without complications: Secondary | ICD-10-CM | POA: Diagnosis not present

## 2016-08-20 DIAGNOSIS — S91104A Unspecified open wound of right lesser toe(s) without damage to nail, initial encounter: Secondary | ICD-10-CM | POA: Diagnosis not present

## 2016-08-20 DIAGNOSIS — E119 Type 2 diabetes mellitus without complications: Secondary | ICD-10-CM | POA: Diagnosis not present

## 2016-08-20 DIAGNOSIS — S91302A Unspecified open wound, left foot, initial encounter: Secondary | ICD-10-CM | POA: Diagnosis not present

## 2016-08-21 DIAGNOSIS — S91311D Laceration without foreign body, right foot, subsequent encounter: Secondary | ICD-10-CM | POA: Diagnosis not present

## 2016-08-21 DIAGNOSIS — E119 Type 2 diabetes mellitus without complications: Secondary | ICD-10-CM | POA: Diagnosis not present

## 2016-08-21 DIAGNOSIS — S91311A Laceration without foreign body, right foot, initial encounter: Secondary | ICD-10-CM | POA: Diagnosis not present

## 2016-08-21 DIAGNOSIS — L89611 Pressure ulcer of right heel, stage 1: Secondary | ICD-10-CM | POA: Diagnosis not present

## 2016-08-21 DIAGNOSIS — L89621 Pressure ulcer of left heel, stage 1: Secondary | ICD-10-CM | POA: Diagnosis not present

## 2016-08-22 DIAGNOSIS — M1990 Unspecified osteoarthritis, unspecified site: Secondary | ICD-10-CM | POA: Diagnosis not present

## 2016-08-22 DIAGNOSIS — F0390 Unspecified dementia without behavioral disturbance: Secondary | ICD-10-CM | POA: Diagnosis not present

## 2016-08-22 DIAGNOSIS — J449 Chronic obstructive pulmonary disease, unspecified: Secondary | ICD-10-CM | POA: Diagnosis not present

## 2016-08-22 DIAGNOSIS — E119 Type 2 diabetes mellitus without complications: Secondary | ICD-10-CM | POA: Diagnosis not present

## 2016-08-28 DIAGNOSIS — N401 Enlarged prostate with lower urinary tract symptoms: Secondary | ICD-10-CM | POA: Diagnosis not present

## 2016-08-28 DIAGNOSIS — J449 Chronic obstructive pulmonary disease, unspecified: Secondary | ICD-10-CM | POA: Diagnosis not present

## 2016-08-28 DIAGNOSIS — M1990 Unspecified osteoarthritis, unspecified site: Secondary | ICD-10-CM | POA: Diagnosis not present

## 2016-08-28 DIAGNOSIS — F0390 Unspecified dementia without behavioral disturbance: Secondary | ICD-10-CM | POA: Diagnosis not present

## 2016-09-03 DIAGNOSIS — L97519 Non-pressure chronic ulcer of other part of right foot with unspecified severity: Secondary | ICD-10-CM | POA: Diagnosis not present

## 2016-09-03 DIAGNOSIS — M7989 Other specified soft tissue disorders: Secondary | ICD-10-CM | POA: Diagnosis not present

## 2016-09-03 DIAGNOSIS — E119 Type 2 diabetes mellitus without complications: Secondary | ICD-10-CM | POA: Diagnosis not present

## 2016-09-03 DIAGNOSIS — L89621 Pressure ulcer of left heel, stage 1: Secondary | ICD-10-CM | POA: Diagnosis not present

## 2016-09-03 DIAGNOSIS — E785 Hyperlipidemia, unspecified: Secondary | ICD-10-CM | POA: Diagnosis not present

## 2016-09-03 DIAGNOSIS — E11621 Type 2 diabetes mellitus with foot ulcer: Secondary | ICD-10-CM | POA: Diagnosis not present

## 2016-09-03 DIAGNOSIS — I10 Essential (primary) hypertension: Secondary | ICD-10-CM | POA: Diagnosis not present

## 2016-09-03 DIAGNOSIS — L538 Other specified erythematous conditions: Secondary | ICD-10-CM | POA: Diagnosis not present

## 2016-09-04 DIAGNOSIS — Z8631 Personal history of diabetic foot ulcer: Secondary | ICD-10-CM | POA: Diagnosis not present

## 2016-09-04 DIAGNOSIS — Z09 Encounter for follow-up examination after completed treatment for conditions other than malignant neoplasm: Secondary | ICD-10-CM | POA: Diagnosis not present

## 2016-09-04 DIAGNOSIS — L97511 Non-pressure chronic ulcer of other part of right foot limited to breakdown of skin: Secondary | ICD-10-CM | POA: Diagnosis not present

## 2016-09-04 DIAGNOSIS — E11621 Type 2 diabetes mellitus with foot ulcer: Secondary | ICD-10-CM | POA: Diagnosis not present

## 2016-09-05 DIAGNOSIS — N401 Enlarged prostate with lower urinary tract symptoms: Secondary | ICD-10-CM | POA: Diagnosis not present

## 2016-09-05 DIAGNOSIS — F0390 Unspecified dementia without behavioral disturbance: Secondary | ICD-10-CM | POA: Diagnosis not present

## 2016-09-05 DIAGNOSIS — R4182 Altered mental status, unspecified: Secondary | ICD-10-CM | POA: Diagnosis not present

## 2016-09-05 DIAGNOSIS — M1990 Unspecified osteoarthritis, unspecified site: Secondary | ICD-10-CM | POA: Diagnosis not present

## 2016-09-10 DIAGNOSIS — N401 Enlarged prostate with lower urinary tract symptoms: Secondary | ICD-10-CM | POA: Diagnosis not present

## 2016-09-10 DIAGNOSIS — F0390 Unspecified dementia without behavioral disturbance: Secondary | ICD-10-CM | POA: Diagnosis not present

## 2016-09-10 DIAGNOSIS — R4182 Altered mental status, unspecified: Secondary | ICD-10-CM | POA: Diagnosis not present

## 2016-09-10 DIAGNOSIS — J449 Chronic obstructive pulmonary disease, unspecified: Secondary | ICD-10-CM | POA: Diagnosis not present

## 2016-09-11 DIAGNOSIS — E119 Type 2 diabetes mellitus without complications: Secondary | ICD-10-CM | POA: Diagnosis not present

## 2016-09-11 DIAGNOSIS — M79675 Pain in left toe(s): Secondary | ICD-10-CM | POA: Diagnosis not present

## 2016-09-11 DIAGNOSIS — B351 Tinea unguium: Secondary | ICD-10-CM | POA: Diagnosis not present

## 2016-09-11 DIAGNOSIS — S90211A Contusion of right great toe with damage to nail, initial encounter: Secondary | ICD-10-CM | POA: Diagnosis not present

## 2016-09-12 DIAGNOSIS — F0390 Unspecified dementia without behavioral disturbance: Secondary | ICD-10-CM | POA: Diagnosis not present

## 2016-09-12 DIAGNOSIS — R4182 Altered mental status, unspecified: Secondary | ICD-10-CM | POA: Diagnosis not present

## 2016-09-12 DIAGNOSIS — M1990 Unspecified osteoarthritis, unspecified site: Secondary | ICD-10-CM | POA: Diagnosis not present

## 2016-09-12 DIAGNOSIS — N401 Enlarged prostate with lower urinary tract symptoms: Secondary | ICD-10-CM | POA: Diagnosis not present

## 2016-09-18 DIAGNOSIS — F0390 Unspecified dementia without behavioral disturbance: Secondary | ICD-10-CM | POA: Diagnosis not present

## 2016-09-18 DIAGNOSIS — E119 Type 2 diabetes mellitus without complications: Secondary | ICD-10-CM | POA: Diagnosis not present

## 2016-09-18 DIAGNOSIS — J449 Chronic obstructive pulmonary disease, unspecified: Secondary | ICD-10-CM | POA: Diagnosis not present

## 2016-09-18 DIAGNOSIS — I10 Essential (primary) hypertension: Secondary | ICD-10-CM | POA: Diagnosis not present

## 2016-09-18 DIAGNOSIS — M1990 Unspecified osteoarthritis, unspecified site: Secondary | ICD-10-CM | POA: Diagnosis not present

## 2016-09-18 DIAGNOSIS — M79671 Pain in right foot: Secondary | ICD-10-CM | POA: Diagnosis not present

## 2016-09-19 DIAGNOSIS — E119 Type 2 diabetes mellitus without complications: Secondary | ICD-10-CM | POA: Diagnosis not present

## 2016-09-19 DIAGNOSIS — I10 Essential (primary) hypertension: Secondary | ICD-10-CM | POA: Diagnosis not present

## 2016-09-19 DIAGNOSIS — M79671 Pain in right foot: Secondary | ICD-10-CM | POA: Diagnosis not present

## 2016-09-19 DIAGNOSIS — J449 Chronic obstructive pulmonary disease, unspecified: Secondary | ICD-10-CM | POA: Diagnosis not present

## 2016-09-19 DIAGNOSIS — F0390 Unspecified dementia without behavioral disturbance: Secondary | ICD-10-CM | POA: Diagnosis not present

## 2016-09-19 DIAGNOSIS — M1990 Unspecified osteoarthritis, unspecified site: Secondary | ICD-10-CM | POA: Diagnosis not present

## 2016-09-20 DIAGNOSIS — Z6826 Body mass index (BMI) 26.0-26.9, adult: Secondary | ICD-10-CM | POA: Diagnosis not present

## 2016-09-20 DIAGNOSIS — J449 Chronic obstructive pulmonary disease, unspecified: Secondary | ICD-10-CM | POA: Diagnosis not present

## 2016-09-20 DIAGNOSIS — E1165 Type 2 diabetes mellitus with hyperglycemia: Secondary | ICD-10-CM | POA: Diagnosis not present

## 2016-09-20 DIAGNOSIS — G309 Alzheimer's disease, unspecified: Secondary | ICD-10-CM | POA: Diagnosis not present

## 2016-09-20 DIAGNOSIS — M79671 Pain in right foot: Secondary | ICD-10-CM | POA: Diagnosis not present

## 2016-09-20 DIAGNOSIS — I2510 Atherosclerotic heart disease of native coronary artery without angina pectoris: Secondary | ICD-10-CM | POA: Diagnosis not present

## 2016-09-20 DIAGNOSIS — I10 Essential (primary) hypertension: Secondary | ICD-10-CM | POA: Diagnosis not present

## 2016-09-20 DIAGNOSIS — Z299 Encounter for prophylactic measures, unspecified: Secondary | ICD-10-CM | POA: Diagnosis not present

## 2016-09-20 DIAGNOSIS — J069 Acute upper respiratory infection, unspecified: Secondary | ICD-10-CM | POA: Diagnosis not present

## 2016-09-20 DIAGNOSIS — M1990 Unspecified osteoarthritis, unspecified site: Secondary | ICD-10-CM | POA: Diagnosis not present

## 2016-09-20 DIAGNOSIS — I509 Heart failure, unspecified: Secondary | ICD-10-CM | POA: Diagnosis not present

## 2016-09-20 DIAGNOSIS — N400 Enlarged prostate without lower urinary tract symptoms: Secondary | ICD-10-CM | POA: Diagnosis not present

## 2016-09-20 DIAGNOSIS — E119 Type 2 diabetes mellitus without complications: Secondary | ICD-10-CM | POA: Diagnosis not present

## 2016-09-20 DIAGNOSIS — F0390 Unspecified dementia without behavioral disturbance: Secondary | ICD-10-CM | POA: Diagnosis not present

## 2016-09-20 DIAGNOSIS — E7800 Pure hypercholesterolemia, unspecified: Secondary | ICD-10-CM | POA: Diagnosis not present

## 2016-09-21 DIAGNOSIS — F0390 Unspecified dementia without behavioral disturbance: Secondary | ICD-10-CM | POA: Diagnosis not present

## 2016-09-21 DIAGNOSIS — J449 Chronic obstructive pulmonary disease, unspecified: Secondary | ICD-10-CM | POA: Diagnosis not present

## 2016-09-21 DIAGNOSIS — I10 Essential (primary) hypertension: Secondary | ICD-10-CM | POA: Diagnosis not present

## 2016-09-21 DIAGNOSIS — M79671 Pain in right foot: Secondary | ICD-10-CM | POA: Diagnosis not present

## 2016-09-21 DIAGNOSIS — M1990 Unspecified osteoarthritis, unspecified site: Secondary | ICD-10-CM | POA: Diagnosis not present

## 2016-09-21 DIAGNOSIS — E119 Type 2 diabetes mellitus without complications: Secondary | ICD-10-CM | POA: Diagnosis not present

## 2016-09-25 DIAGNOSIS — M1990 Unspecified osteoarthritis, unspecified site: Secondary | ICD-10-CM | POA: Diagnosis not present

## 2016-09-25 DIAGNOSIS — E119 Type 2 diabetes mellitus without complications: Secondary | ICD-10-CM | POA: Diagnosis not present

## 2016-09-25 DIAGNOSIS — J449 Chronic obstructive pulmonary disease, unspecified: Secondary | ICD-10-CM | POA: Diagnosis not present

## 2016-09-25 DIAGNOSIS — F0390 Unspecified dementia without behavioral disturbance: Secondary | ICD-10-CM | POA: Diagnosis not present

## 2016-09-25 DIAGNOSIS — M79671 Pain in right foot: Secondary | ICD-10-CM | POA: Diagnosis not present

## 2016-09-25 DIAGNOSIS — I10 Essential (primary) hypertension: Secondary | ICD-10-CM | POA: Diagnosis not present

## 2016-09-26 DIAGNOSIS — M79671 Pain in right foot: Secondary | ICD-10-CM | POA: Diagnosis not present

## 2016-09-26 DIAGNOSIS — F0390 Unspecified dementia without behavioral disturbance: Secondary | ICD-10-CM | POA: Diagnosis not present

## 2016-09-26 DIAGNOSIS — J449 Chronic obstructive pulmonary disease, unspecified: Secondary | ICD-10-CM | POA: Diagnosis not present

## 2016-09-26 DIAGNOSIS — I10 Essential (primary) hypertension: Secondary | ICD-10-CM | POA: Diagnosis not present

## 2016-09-26 DIAGNOSIS — E119 Type 2 diabetes mellitus without complications: Secondary | ICD-10-CM | POA: Diagnosis not present

## 2016-09-26 DIAGNOSIS — M1990 Unspecified osteoarthritis, unspecified site: Secondary | ICD-10-CM | POA: Diagnosis not present

## 2016-09-28 DIAGNOSIS — I10 Essential (primary) hypertension: Secondary | ICD-10-CM | POA: Diagnosis not present

## 2016-09-28 DIAGNOSIS — E119 Type 2 diabetes mellitus without complications: Secondary | ICD-10-CM | POA: Diagnosis not present

## 2016-09-28 DIAGNOSIS — J449 Chronic obstructive pulmonary disease, unspecified: Secondary | ICD-10-CM | POA: Diagnosis not present

## 2016-09-28 DIAGNOSIS — M1990 Unspecified osteoarthritis, unspecified site: Secondary | ICD-10-CM | POA: Diagnosis not present

## 2016-09-28 DIAGNOSIS — F0390 Unspecified dementia without behavioral disturbance: Secondary | ICD-10-CM | POA: Diagnosis not present

## 2016-09-28 DIAGNOSIS — M79671 Pain in right foot: Secondary | ICD-10-CM | POA: Diagnosis not present

## 2016-10-01 DIAGNOSIS — F0390 Unspecified dementia without behavioral disturbance: Secondary | ICD-10-CM | POA: Diagnosis not present

## 2016-10-01 DIAGNOSIS — E119 Type 2 diabetes mellitus without complications: Secondary | ICD-10-CM | POA: Diagnosis not present

## 2016-10-01 DIAGNOSIS — M79671 Pain in right foot: Secondary | ICD-10-CM | POA: Diagnosis not present

## 2016-10-01 DIAGNOSIS — J449 Chronic obstructive pulmonary disease, unspecified: Secondary | ICD-10-CM | POA: Diagnosis not present

## 2016-10-01 DIAGNOSIS — I10 Essential (primary) hypertension: Secondary | ICD-10-CM | POA: Diagnosis not present

## 2016-10-01 DIAGNOSIS — M1990 Unspecified osteoarthritis, unspecified site: Secondary | ICD-10-CM | POA: Diagnosis not present

## 2016-10-02 DIAGNOSIS — M79671 Pain in right foot: Secondary | ICD-10-CM | POA: Diagnosis not present

## 2016-10-02 DIAGNOSIS — E119 Type 2 diabetes mellitus without complications: Secondary | ICD-10-CM | POA: Diagnosis not present

## 2016-10-02 DIAGNOSIS — F0390 Unspecified dementia without behavioral disturbance: Secondary | ICD-10-CM | POA: Diagnosis not present

## 2016-10-02 DIAGNOSIS — J449 Chronic obstructive pulmonary disease, unspecified: Secondary | ICD-10-CM | POA: Diagnosis not present

## 2016-10-02 DIAGNOSIS — M1990 Unspecified osteoarthritis, unspecified site: Secondary | ICD-10-CM | POA: Diagnosis not present

## 2016-10-02 DIAGNOSIS — I10 Essential (primary) hypertension: Secondary | ICD-10-CM | POA: Diagnosis not present

## 2016-10-04 DIAGNOSIS — F0390 Unspecified dementia without behavioral disturbance: Secondary | ICD-10-CM | POA: Diagnosis not present

## 2016-10-04 DIAGNOSIS — M79671 Pain in right foot: Secondary | ICD-10-CM | POA: Diagnosis not present

## 2016-10-04 DIAGNOSIS — J449 Chronic obstructive pulmonary disease, unspecified: Secondary | ICD-10-CM | POA: Diagnosis not present

## 2016-10-04 DIAGNOSIS — E119 Type 2 diabetes mellitus without complications: Secondary | ICD-10-CM | POA: Diagnosis not present

## 2016-10-04 DIAGNOSIS — M1990 Unspecified osteoarthritis, unspecified site: Secondary | ICD-10-CM | POA: Diagnosis not present

## 2016-10-04 DIAGNOSIS — I10 Essential (primary) hypertension: Secondary | ICD-10-CM | POA: Diagnosis not present

## 2016-10-05 DIAGNOSIS — M1990 Unspecified osteoarthritis, unspecified site: Secondary | ICD-10-CM | POA: Diagnosis not present

## 2016-10-05 DIAGNOSIS — J449 Chronic obstructive pulmonary disease, unspecified: Secondary | ICD-10-CM | POA: Diagnosis not present

## 2016-10-05 DIAGNOSIS — I10 Essential (primary) hypertension: Secondary | ICD-10-CM | POA: Diagnosis not present

## 2016-10-05 DIAGNOSIS — F0390 Unspecified dementia without behavioral disturbance: Secondary | ICD-10-CM | POA: Diagnosis not present

## 2016-10-05 DIAGNOSIS — M79671 Pain in right foot: Secondary | ICD-10-CM | POA: Diagnosis not present

## 2016-10-05 DIAGNOSIS — E119 Type 2 diabetes mellitus without complications: Secondary | ICD-10-CM | POA: Diagnosis not present

## 2016-10-09 DIAGNOSIS — I10 Essential (primary) hypertension: Secondary | ICD-10-CM | POA: Diagnosis not present

## 2016-10-09 DIAGNOSIS — E119 Type 2 diabetes mellitus without complications: Secondary | ICD-10-CM | POA: Diagnosis not present

## 2016-10-09 DIAGNOSIS — F0390 Unspecified dementia without behavioral disturbance: Secondary | ICD-10-CM | POA: Diagnosis not present

## 2016-10-09 DIAGNOSIS — J449 Chronic obstructive pulmonary disease, unspecified: Secondary | ICD-10-CM | POA: Diagnosis not present

## 2016-10-09 DIAGNOSIS — M1990 Unspecified osteoarthritis, unspecified site: Secondary | ICD-10-CM | POA: Diagnosis not present

## 2016-10-09 DIAGNOSIS — M79671 Pain in right foot: Secondary | ICD-10-CM | POA: Diagnosis not present

## 2016-10-11 DIAGNOSIS — E119 Type 2 diabetes mellitus without complications: Secondary | ICD-10-CM | POA: Diagnosis not present

## 2016-10-11 DIAGNOSIS — J449 Chronic obstructive pulmonary disease, unspecified: Secondary | ICD-10-CM | POA: Diagnosis not present

## 2016-10-11 DIAGNOSIS — M1990 Unspecified osteoarthritis, unspecified site: Secondary | ICD-10-CM | POA: Diagnosis not present

## 2016-10-11 DIAGNOSIS — F0390 Unspecified dementia without behavioral disturbance: Secondary | ICD-10-CM | POA: Diagnosis not present

## 2016-10-11 DIAGNOSIS — M79671 Pain in right foot: Secondary | ICD-10-CM | POA: Diagnosis not present

## 2016-10-11 DIAGNOSIS — I10 Essential (primary) hypertension: Secondary | ICD-10-CM | POA: Diagnosis not present

## 2016-10-12 DIAGNOSIS — I10 Essential (primary) hypertension: Secondary | ICD-10-CM | POA: Diagnosis not present

## 2016-10-12 DIAGNOSIS — M79671 Pain in right foot: Secondary | ICD-10-CM | POA: Diagnosis not present

## 2016-10-12 DIAGNOSIS — M1990 Unspecified osteoarthritis, unspecified site: Secondary | ICD-10-CM | POA: Diagnosis not present

## 2016-10-12 DIAGNOSIS — J449 Chronic obstructive pulmonary disease, unspecified: Secondary | ICD-10-CM | POA: Diagnosis not present

## 2016-10-12 DIAGNOSIS — E119 Type 2 diabetes mellitus without complications: Secondary | ICD-10-CM | POA: Diagnosis not present

## 2016-10-12 DIAGNOSIS — F0390 Unspecified dementia without behavioral disturbance: Secondary | ICD-10-CM | POA: Diagnosis not present

## 2016-10-16 DIAGNOSIS — M1990 Unspecified osteoarthritis, unspecified site: Secondary | ICD-10-CM | POA: Diagnosis not present

## 2016-10-16 DIAGNOSIS — J449 Chronic obstructive pulmonary disease, unspecified: Secondary | ICD-10-CM | POA: Diagnosis not present

## 2016-10-16 DIAGNOSIS — F0390 Unspecified dementia without behavioral disturbance: Secondary | ICD-10-CM | POA: Diagnosis not present

## 2016-10-16 DIAGNOSIS — I10 Essential (primary) hypertension: Secondary | ICD-10-CM | POA: Diagnosis not present

## 2016-10-16 DIAGNOSIS — E119 Type 2 diabetes mellitus without complications: Secondary | ICD-10-CM | POA: Diagnosis not present

## 2016-10-16 DIAGNOSIS — M79671 Pain in right foot: Secondary | ICD-10-CM | POA: Diagnosis not present

## 2016-11-08 DIAGNOSIS — I509 Heart failure, unspecified: Secondary | ICD-10-CM | POA: Diagnosis not present

## 2016-11-08 DIAGNOSIS — G4700 Insomnia, unspecified: Secondary | ICD-10-CM | POA: Diagnosis not present

## 2016-11-08 DIAGNOSIS — I2510 Atherosclerotic heart disease of native coronary artery without angina pectoris: Secondary | ICD-10-CM | POA: Diagnosis not present

## 2016-11-08 DIAGNOSIS — E1165 Type 2 diabetes mellitus with hyperglycemia: Secondary | ICD-10-CM | POA: Diagnosis not present

## 2016-11-08 DIAGNOSIS — R5383 Other fatigue: Secondary | ICD-10-CM | POA: Diagnosis not present

## 2016-11-08 DIAGNOSIS — Z6827 Body mass index (BMI) 27.0-27.9, adult: Secondary | ICD-10-CM | POA: Diagnosis not present

## 2016-11-08 DIAGNOSIS — R252 Cramp and spasm: Secondary | ICD-10-CM | POA: Diagnosis not present

## 2016-11-08 DIAGNOSIS — Z299 Encounter for prophylactic measures, unspecified: Secondary | ICD-10-CM | POA: Diagnosis not present

## 2016-11-08 DIAGNOSIS — I10 Essential (primary) hypertension: Secondary | ICD-10-CM | POA: Diagnosis not present

## 2016-11-08 DIAGNOSIS — G309 Alzheimer's disease, unspecified: Secondary | ICD-10-CM | POA: Diagnosis not present

## 2016-11-08 DIAGNOSIS — J449 Chronic obstructive pulmonary disease, unspecified: Secondary | ICD-10-CM | POA: Diagnosis not present

## 2016-11-08 DIAGNOSIS — F0280 Dementia in other diseases classified elsewhere without behavioral disturbance: Secondary | ICD-10-CM | POA: Diagnosis not present

## 2016-11-29 DIAGNOSIS — E119 Type 2 diabetes mellitus without complications: Secondary | ICD-10-CM | POA: Diagnosis not present

## 2016-11-29 DIAGNOSIS — I10 Essential (primary) hypertension: Secondary | ICD-10-CM | POA: Diagnosis not present

## 2016-12-19 DIAGNOSIS — B351 Tinea unguium: Secondary | ICD-10-CM | POA: Diagnosis not present

## 2016-12-19 DIAGNOSIS — E1142 Type 2 diabetes mellitus with diabetic polyneuropathy: Secondary | ICD-10-CM | POA: Diagnosis not present

## 2016-12-19 DIAGNOSIS — M79676 Pain in unspecified toe(s): Secondary | ICD-10-CM | POA: Diagnosis not present

## 2016-12-24 DIAGNOSIS — Z1331 Encounter for screening for depression: Secondary | ICD-10-CM | POA: Diagnosis not present

## 2016-12-24 DIAGNOSIS — Z125 Encounter for screening for malignant neoplasm of prostate: Secondary | ICD-10-CM | POA: Diagnosis not present

## 2016-12-24 DIAGNOSIS — Z299 Encounter for prophylactic measures, unspecified: Secondary | ICD-10-CM | POA: Diagnosis not present

## 2016-12-24 DIAGNOSIS — Z79899 Other long term (current) drug therapy: Secondary | ICD-10-CM | POA: Diagnosis not present

## 2016-12-24 DIAGNOSIS — Z0000 Encounter for general adult medical examination without abnormal findings: Secondary | ICD-10-CM | POA: Diagnosis not present

## 2016-12-24 DIAGNOSIS — E1165 Type 2 diabetes mellitus with hyperglycemia: Secondary | ICD-10-CM | POA: Diagnosis not present

## 2016-12-24 DIAGNOSIS — Z7189 Other specified counseling: Secondary | ICD-10-CM | POA: Diagnosis not present

## 2016-12-24 DIAGNOSIS — E7800 Pure hypercholesterolemia, unspecified: Secondary | ICD-10-CM | POA: Diagnosis not present

## 2016-12-24 DIAGNOSIS — I10 Essential (primary) hypertension: Secondary | ICD-10-CM | POA: Diagnosis not present

## 2016-12-24 DIAGNOSIS — Z6827 Body mass index (BMI) 27.0-27.9, adult: Secondary | ICD-10-CM | POA: Diagnosis not present

## 2016-12-24 DIAGNOSIS — R5383 Other fatigue: Secondary | ICD-10-CM | POA: Diagnosis not present

## 2016-12-24 DIAGNOSIS — I509 Heart failure, unspecified: Secondary | ICD-10-CM | POA: Diagnosis not present

## 2016-12-24 DIAGNOSIS — F419 Anxiety disorder, unspecified: Secondary | ICD-10-CM | POA: Diagnosis not present

## 2016-12-24 DIAGNOSIS — Z1339 Encounter for screening examination for other mental health and behavioral disorders: Secondary | ICD-10-CM | POA: Diagnosis not present

## 2017-01-01 DIAGNOSIS — I509 Heart failure, unspecified: Secondary | ICD-10-CM | POA: Diagnosis not present

## 2017-01-01 DIAGNOSIS — Z299 Encounter for prophylactic measures, unspecified: Secondary | ICD-10-CM | POA: Diagnosis not present

## 2017-01-01 DIAGNOSIS — Z6827 Body mass index (BMI) 27.0-27.9, adult: Secondary | ICD-10-CM | POA: Diagnosis not present

## 2017-01-01 DIAGNOSIS — J449 Chronic obstructive pulmonary disease, unspecified: Secondary | ICD-10-CM | POA: Diagnosis not present

## 2017-01-01 DIAGNOSIS — Z23 Encounter for immunization: Secondary | ICD-10-CM | POA: Diagnosis not present

## 2017-01-01 DIAGNOSIS — E1165 Type 2 diabetes mellitus with hyperglycemia: Secondary | ICD-10-CM | POA: Diagnosis not present

## 2017-01-02 DIAGNOSIS — H43811 Vitreous degeneration, right eye: Secondary | ICD-10-CM | POA: Diagnosis not present

## 2017-01-09 DIAGNOSIS — E119 Type 2 diabetes mellitus without complications: Secondary | ICD-10-CM | POA: Diagnosis not present

## 2017-01-09 DIAGNOSIS — I10 Essential (primary) hypertension: Secondary | ICD-10-CM | POA: Diagnosis not present

## 2017-01-21 DIAGNOSIS — G309 Alzheimer's disease, unspecified: Secondary | ICD-10-CM | POA: Diagnosis not present

## 2017-01-21 DIAGNOSIS — Z6827 Body mass index (BMI) 27.0-27.9, adult: Secondary | ICD-10-CM | POA: Diagnosis not present

## 2017-01-21 DIAGNOSIS — E11621 Type 2 diabetes mellitus with foot ulcer: Secondary | ICD-10-CM | POA: Diagnosis not present

## 2017-01-21 DIAGNOSIS — Z299 Encounter for prophylactic measures, unspecified: Secondary | ICD-10-CM | POA: Diagnosis not present

## 2017-01-21 DIAGNOSIS — L97509 Non-pressure chronic ulcer of other part of unspecified foot with unspecified severity: Secondary | ICD-10-CM | POA: Diagnosis not present

## 2017-01-21 DIAGNOSIS — F0280 Dementia in other diseases classified elsewhere without behavioral disturbance: Secondary | ICD-10-CM | POA: Diagnosis not present

## 2017-01-21 DIAGNOSIS — B351 Tinea unguium: Secondary | ICD-10-CM | POA: Diagnosis not present

## 2017-01-21 DIAGNOSIS — J449 Chronic obstructive pulmonary disease, unspecified: Secondary | ICD-10-CM | POA: Diagnosis not present

## 2017-01-21 DIAGNOSIS — Z713 Dietary counseling and surveillance: Secondary | ICD-10-CM | POA: Diagnosis not present

## 2017-01-24 DIAGNOSIS — S90211A Contusion of right great toe with damage to nail, initial encounter: Secondary | ICD-10-CM | POA: Diagnosis not present

## 2017-01-29 DIAGNOSIS — S90211D Contusion of right great toe with damage to nail, subsequent encounter: Secondary | ICD-10-CM | POA: Diagnosis not present

## 2017-01-30 DIAGNOSIS — H43811 Vitreous degeneration, right eye: Secondary | ICD-10-CM | POA: Diagnosis not present

## 2017-02-11 DIAGNOSIS — L570 Actinic keratosis: Secondary | ICD-10-CM | POA: Diagnosis not present

## 2017-02-11 DIAGNOSIS — D229 Melanocytic nevi, unspecified: Secondary | ICD-10-CM | POA: Diagnosis not present

## 2017-02-11 DIAGNOSIS — B351 Tinea unguium: Secondary | ICD-10-CM | POA: Diagnosis not present

## 2017-02-12 DIAGNOSIS — I509 Heart failure, unspecified: Secondary | ICD-10-CM | POA: Diagnosis not present

## 2017-02-12 DIAGNOSIS — E1165 Type 2 diabetes mellitus with hyperglycemia: Secondary | ICD-10-CM | POA: Diagnosis not present

## 2017-02-12 DIAGNOSIS — J209 Acute bronchitis, unspecified: Secondary | ICD-10-CM | POA: Diagnosis not present

## 2017-02-12 DIAGNOSIS — Z299 Encounter for prophylactic measures, unspecified: Secondary | ICD-10-CM | POA: Diagnosis not present

## 2017-02-12 DIAGNOSIS — Z6828 Body mass index (BMI) 28.0-28.9, adult: Secondary | ICD-10-CM | POA: Diagnosis not present

## 2017-02-12 DIAGNOSIS — J449 Chronic obstructive pulmonary disease, unspecified: Secondary | ICD-10-CM | POA: Diagnosis not present

## 2017-02-14 DIAGNOSIS — S90211A Contusion of right great toe with damage to nail, initial encounter: Secondary | ICD-10-CM | POA: Diagnosis not present

## 2017-02-14 DIAGNOSIS — S90211D Contusion of right great toe with damage to nail, subsequent encounter: Secondary | ICD-10-CM | POA: Diagnosis not present

## 2017-02-22 DIAGNOSIS — E119 Type 2 diabetes mellitus without complications: Secondary | ICD-10-CM | POA: Diagnosis not present

## 2017-02-22 DIAGNOSIS — I10 Essential (primary) hypertension: Secondary | ICD-10-CM | POA: Diagnosis not present

## 2017-02-27 DIAGNOSIS — E119 Type 2 diabetes mellitus without complications: Secondary | ICD-10-CM | POA: Diagnosis not present

## 2017-02-27 DIAGNOSIS — I10 Essential (primary) hypertension: Secondary | ICD-10-CM | POA: Diagnosis not present

## 2017-03-06 DIAGNOSIS — L97511 Non-pressure chronic ulcer of other part of right foot limited to breakdown of skin: Secondary | ICD-10-CM | POA: Diagnosis not present

## 2017-03-06 DIAGNOSIS — E1142 Type 2 diabetes mellitus with diabetic polyneuropathy: Secondary | ICD-10-CM | POA: Diagnosis not present

## 2017-03-08 DIAGNOSIS — I10 Essential (primary) hypertension: Secondary | ICD-10-CM | POA: Diagnosis not present

## 2017-03-08 DIAGNOSIS — Z299 Encounter for prophylactic measures, unspecified: Secondary | ICD-10-CM | POA: Diagnosis not present

## 2017-03-08 DIAGNOSIS — E1165 Type 2 diabetes mellitus with hyperglycemia: Secondary | ICD-10-CM | POA: Diagnosis not present

## 2017-03-08 DIAGNOSIS — I509 Heart failure, unspecified: Secondary | ICD-10-CM | POA: Diagnosis not present

## 2017-03-08 DIAGNOSIS — Z6828 Body mass index (BMI) 28.0-28.9, adult: Secondary | ICD-10-CM | POA: Diagnosis not present

## 2017-03-08 DIAGNOSIS — R319 Hematuria, unspecified: Secondary | ICD-10-CM | POA: Diagnosis not present

## 2017-03-08 DIAGNOSIS — R109 Unspecified abdominal pain: Secondary | ICD-10-CM | POA: Diagnosis not present

## 2017-03-20 DIAGNOSIS — L97511 Non-pressure chronic ulcer of other part of right foot limited to breakdown of skin: Secondary | ICD-10-CM | POA: Diagnosis not present

## 2017-04-16 DIAGNOSIS — Z09 Encounter for follow-up examination after completed treatment for conditions other than malignant neoplasm: Secondary | ICD-10-CM | POA: Diagnosis not present

## 2017-04-16 DIAGNOSIS — Z872 Personal history of diseases of the skin and subcutaneous tissue: Secondary | ICD-10-CM | POA: Diagnosis not present

## 2017-04-16 DIAGNOSIS — S90211D Contusion of right great toe with damage to nail, subsequent encounter: Secondary | ICD-10-CM | POA: Diagnosis not present

## 2017-05-15 DIAGNOSIS — B351 Tinea unguium: Secondary | ICD-10-CM | POA: Diagnosis not present

## 2017-05-15 DIAGNOSIS — M79676 Pain in unspecified toe(s): Secondary | ICD-10-CM | POA: Diagnosis not present

## 2017-05-15 DIAGNOSIS — E1142 Type 2 diabetes mellitus with diabetic polyneuropathy: Secondary | ICD-10-CM | POA: Diagnosis not present

## 2017-05-15 DIAGNOSIS — L84 Corns and callosities: Secondary | ICD-10-CM | POA: Diagnosis not present

## 2017-05-31 DIAGNOSIS — J449 Chronic obstructive pulmonary disease, unspecified: Secondary | ICD-10-CM | POA: Diagnosis not present

## 2017-05-31 DIAGNOSIS — Z6828 Body mass index (BMI) 28.0-28.9, adult: Secondary | ICD-10-CM | POA: Diagnosis not present

## 2017-05-31 DIAGNOSIS — E1140 Type 2 diabetes mellitus with diabetic neuropathy, unspecified: Secondary | ICD-10-CM | POA: Diagnosis not present

## 2017-05-31 DIAGNOSIS — M1990 Unspecified osteoarthritis, unspecified site: Secondary | ICD-10-CM | POA: Diagnosis not present

## 2017-05-31 DIAGNOSIS — Z299 Encounter for prophylactic measures, unspecified: Secondary | ICD-10-CM | POA: Diagnosis not present

## 2017-05-31 DIAGNOSIS — E1165 Type 2 diabetes mellitus with hyperglycemia: Secondary | ICD-10-CM | POA: Diagnosis not present

## 2017-05-31 DIAGNOSIS — I509 Heart failure, unspecified: Secondary | ICD-10-CM | POA: Diagnosis not present

## 2017-05-31 DIAGNOSIS — M79606 Pain in leg, unspecified: Secondary | ICD-10-CM | POA: Diagnosis not present

## 2017-06-11 DIAGNOSIS — M79604 Pain in right leg: Secondary | ICD-10-CM | POA: Diagnosis not present

## 2017-06-11 DIAGNOSIS — M79605 Pain in left leg: Secondary | ICD-10-CM | POA: Diagnosis not present

## 2017-06-14 DIAGNOSIS — M79605 Pain in left leg: Secondary | ICD-10-CM | POA: Diagnosis not present

## 2017-06-14 DIAGNOSIS — M79604 Pain in right leg: Secondary | ICD-10-CM | POA: Diagnosis not present

## 2017-06-17 DIAGNOSIS — M79605 Pain in left leg: Secondary | ICD-10-CM | POA: Diagnosis not present

## 2017-06-17 DIAGNOSIS — M79604 Pain in right leg: Secondary | ICD-10-CM | POA: Diagnosis not present

## 2017-06-18 DIAGNOSIS — M79605 Pain in left leg: Secondary | ICD-10-CM | POA: Diagnosis not present

## 2017-06-18 DIAGNOSIS — M79604 Pain in right leg: Secondary | ICD-10-CM | POA: Diagnosis not present

## 2017-06-20 DIAGNOSIS — M79604 Pain in right leg: Secondary | ICD-10-CM | POA: Diagnosis not present

## 2017-06-20 DIAGNOSIS — M79605 Pain in left leg: Secondary | ICD-10-CM | POA: Diagnosis not present

## 2017-06-24 DIAGNOSIS — M79605 Pain in left leg: Secondary | ICD-10-CM | POA: Diagnosis not present

## 2017-06-24 DIAGNOSIS — M79604 Pain in right leg: Secondary | ICD-10-CM | POA: Diagnosis not present

## 2017-06-25 DIAGNOSIS — M79604 Pain in right leg: Secondary | ICD-10-CM | POA: Diagnosis not present

## 2017-06-25 DIAGNOSIS — M79605 Pain in left leg: Secondary | ICD-10-CM | POA: Diagnosis not present

## 2017-06-27 DIAGNOSIS — M79604 Pain in right leg: Secondary | ICD-10-CM | POA: Diagnosis not present

## 2017-06-27 DIAGNOSIS — M79605 Pain in left leg: Secondary | ICD-10-CM | POA: Diagnosis not present

## 2017-07-01 DIAGNOSIS — M79604 Pain in right leg: Secondary | ICD-10-CM | POA: Diagnosis not present

## 2017-07-01 DIAGNOSIS — M79605 Pain in left leg: Secondary | ICD-10-CM | POA: Diagnosis not present

## 2017-07-04 DIAGNOSIS — M79604 Pain in right leg: Secondary | ICD-10-CM | POA: Diagnosis not present

## 2017-07-04 DIAGNOSIS — M79605 Pain in left leg: Secondary | ICD-10-CM | POA: Diagnosis not present

## 2017-07-05 DIAGNOSIS — M79604 Pain in right leg: Secondary | ICD-10-CM | POA: Diagnosis not present

## 2017-07-05 DIAGNOSIS — M79605 Pain in left leg: Secondary | ICD-10-CM | POA: Diagnosis not present

## 2017-07-08 DIAGNOSIS — M79605 Pain in left leg: Secondary | ICD-10-CM | POA: Diagnosis not present

## 2017-07-08 DIAGNOSIS — M79604 Pain in right leg: Secondary | ICD-10-CM | POA: Diagnosis not present

## 2017-07-09 DIAGNOSIS — M79604 Pain in right leg: Secondary | ICD-10-CM | POA: Diagnosis not present

## 2017-07-09 DIAGNOSIS — M79605 Pain in left leg: Secondary | ICD-10-CM | POA: Diagnosis not present

## 2017-07-11 DIAGNOSIS — M79604 Pain in right leg: Secondary | ICD-10-CM | POA: Diagnosis not present

## 2017-07-11 DIAGNOSIS — M79605 Pain in left leg: Secondary | ICD-10-CM | POA: Diagnosis not present

## 2017-07-15 DIAGNOSIS — M79605 Pain in left leg: Secondary | ICD-10-CM | POA: Diagnosis not present

## 2017-07-15 DIAGNOSIS — M79604 Pain in right leg: Secondary | ICD-10-CM | POA: Diagnosis not present

## 2017-07-16 DIAGNOSIS — M79604 Pain in right leg: Secondary | ICD-10-CM | POA: Diagnosis not present

## 2017-07-16 DIAGNOSIS — M79605 Pain in left leg: Secondary | ICD-10-CM | POA: Diagnosis not present

## 2017-07-18 DIAGNOSIS — M79604 Pain in right leg: Secondary | ICD-10-CM | POA: Diagnosis not present

## 2017-07-18 DIAGNOSIS — M79605 Pain in left leg: Secondary | ICD-10-CM | POA: Diagnosis not present

## 2017-07-23 DIAGNOSIS — J209 Acute bronchitis, unspecified: Secondary | ICD-10-CM | POA: Diagnosis not present

## 2017-07-23 DIAGNOSIS — J440 Chronic obstructive pulmonary disease with acute lower respiratory infection: Secondary | ICD-10-CM | POA: Diagnosis not present

## 2017-07-23 DIAGNOSIS — J449 Chronic obstructive pulmonary disease, unspecified: Secondary | ICD-10-CM | POA: Diagnosis not present

## 2017-07-23 DIAGNOSIS — E1140 Type 2 diabetes mellitus with diabetic neuropathy, unspecified: Secondary | ICD-10-CM | POA: Diagnosis not present

## 2017-07-23 DIAGNOSIS — F0390 Unspecified dementia without behavioral disturbance: Secondary | ICD-10-CM | POA: Diagnosis not present

## 2017-07-23 DIAGNOSIS — Z299 Encounter for prophylactic measures, unspecified: Secondary | ICD-10-CM | POA: Diagnosis not present

## 2017-07-23 DIAGNOSIS — Z6827 Body mass index (BMI) 27.0-27.9, adult: Secondary | ICD-10-CM | POA: Diagnosis not present

## 2017-07-24 DIAGNOSIS — B351 Tinea unguium: Secondary | ICD-10-CM | POA: Diagnosis not present

## 2017-07-24 DIAGNOSIS — M79676 Pain in unspecified toe(s): Secondary | ICD-10-CM | POA: Diagnosis not present

## 2017-07-24 DIAGNOSIS — E1142 Type 2 diabetes mellitus with diabetic polyneuropathy: Secondary | ICD-10-CM | POA: Diagnosis not present

## 2017-07-24 DIAGNOSIS — L84 Corns and callosities: Secondary | ICD-10-CM | POA: Diagnosis not present

## 2017-08-07 DIAGNOSIS — Z299 Encounter for prophylactic measures, unspecified: Secondary | ICD-10-CM | POA: Diagnosis not present

## 2017-08-07 DIAGNOSIS — J069 Acute upper respiratory infection, unspecified: Secondary | ICD-10-CM | POA: Diagnosis not present

## 2017-08-07 DIAGNOSIS — I10 Essential (primary) hypertension: Secondary | ICD-10-CM | POA: Diagnosis not present

## 2017-08-07 DIAGNOSIS — E1165 Type 2 diabetes mellitus with hyperglycemia: Secondary | ICD-10-CM | POA: Diagnosis not present

## 2017-08-07 DIAGNOSIS — M25552 Pain in left hip: Secondary | ICD-10-CM | POA: Diagnosis not present

## 2017-08-07 DIAGNOSIS — Z6826 Body mass index (BMI) 26.0-26.9, adult: Secondary | ICD-10-CM | POA: Diagnosis not present

## 2017-08-07 DIAGNOSIS — M1990 Unspecified osteoarthritis, unspecified site: Secondary | ICD-10-CM | POA: Diagnosis not present

## 2017-08-16 DIAGNOSIS — I10 Essential (primary) hypertension: Secondary | ICD-10-CM | POA: Diagnosis not present

## 2017-08-16 DIAGNOSIS — R0602 Shortness of breath: Secondary | ICD-10-CM | POA: Diagnosis not present

## 2017-08-16 DIAGNOSIS — Z299 Encounter for prophylactic measures, unspecified: Secondary | ICD-10-CM | POA: Diagnosis not present

## 2017-08-16 DIAGNOSIS — J449 Chronic obstructive pulmonary disease, unspecified: Secondary | ICD-10-CM | POA: Diagnosis not present

## 2017-08-16 DIAGNOSIS — M79606 Pain in leg, unspecified: Secondary | ICD-10-CM | POA: Diagnosis not present

## 2017-08-16 DIAGNOSIS — R05 Cough: Secondary | ICD-10-CM | POA: Diagnosis not present

## 2017-08-16 DIAGNOSIS — Z6826 Body mass index (BMI) 26.0-26.9, adult: Secondary | ICD-10-CM | POA: Diagnosis not present

## 2017-08-16 DIAGNOSIS — S90822A Blister (nonthermal), left foot, initial encounter: Secondary | ICD-10-CM | POA: Diagnosis not present

## 2017-08-16 DIAGNOSIS — E1165 Type 2 diabetes mellitus with hyperglycemia: Secondary | ICD-10-CM | POA: Diagnosis not present

## 2017-08-22 DIAGNOSIS — S90211D Contusion of right great toe with damage to nail, subsequent encounter: Secondary | ICD-10-CM | POA: Diagnosis not present

## 2017-08-22 DIAGNOSIS — E119 Type 2 diabetes mellitus without complications: Secondary | ICD-10-CM | POA: Diagnosis not present

## 2017-08-22 DIAGNOSIS — M7981 Nontraumatic hematoma of soft tissue: Secondary | ICD-10-CM | POA: Diagnosis not present

## 2017-08-25 DIAGNOSIS — E1140 Type 2 diabetes mellitus with diabetic neuropathy, unspecified: Secondary | ICD-10-CM | POA: Diagnosis not present

## 2017-08-25 DIAGNOSIS — L89620 Pressure ulcer of left heel, unstageable: Secondary | ICD-10-CM | POA: Diagnosis not present

## 2017-08-25 DIAGNOSIS — I110 Hypertensive heart disease with heart failure: Secondary | ICD-10-CM | POA: Diagnosis not present

## 2017-08-25 DIAGNOSIS — E1165 Type 2 diabetes mellitus with hyperglycemia: Secondary | ICD-10-CM | POA: Diagnosis not present

## 2017-08-25 DIAGNOSIS — I509 Heart failure, unspecified: Secondary | ICD-10-CM | POA: Diagnosis not present

## 2017-08-25 DIAGNOSIS — J449 Chronic obstructive pulmonary disease, unspecified: Secondary | ICD-10-CM | POA: Diagnosis not present

## 2017-08-26 DIAGNOSIS — I739 Peripheral vascular disease, unspecified: Secondary | ICD-10-CM | POA: Diagnosis not present

## 2017-08-26 DIAGNOSIS — I70209 Unspecified atherosclerosis of native arteries of extremities, unspecified extremity: Secondary | ICD-10-CM | POA: Diagnosis not present

## 2017-08-28 DIAGNOSIS — J449 Chronic obstructive pulmonary disease, unspecified: Secondary | ICD-10-CM | POA: Diagnosis not present

## 2017-08-28 DIAGNOSIS — E1165 Type 2 diabetes mellitus with hyperglycemia: Secondary | ICD-10-CM | POA: Diagnosis not present

## 2017-08-28 DIAGNOSIS — I509 Heart failure, unspecified: Secondary | ICD-10-CM | POA: Diagnosis not present

## 2017-08-28 DIAGNOSIS — I110 Hypertensive heart disease with heart failure: Secondary | ICD-10-CM | POA: Diagnosis not present

## 2017-08-28 DIAGNOSIS — L89620 Pressure ulcer of left heel, unstageable: Secondary | ICD-10-CM | POA: Diagnosis not present

## 2017-08-28 DIAGNOSIS — E1140 Type 2 diabetes mellitus with diabetic neuropathy, unspecified: Secondary | ICD-10-CM | POA: Diagnosis not present

## 2017-08-30 DIAGNOSIS — L89620 Pressure ulcer of left heel, unstageable: Secondary | ICD-10-CM | POA: Diagnosis not present

## 2017-08-30 DIAGNOSIS — E1140 Type 2 diabetes mellitus with diabetic neuropathy, unspecified: Secondary | ICD-10-CM | POA: Diagnosis not present

## 2017-08-30 DIAGNOSIS — J449 Chronic obstructive pulmonary disease, unspecified: Secondary | ICD-10-CM | POA: Diagnosis not present

## 2017-08-30 DIAGNOSIS — I110 Hypertensive heart disease with heart failure: Secondary | ICD-10-CM | POA: Diagnosis not present

## 2017-08-30 DIAGNOSIS — I509 Heart failure, unspecified: Secondary | ICD-10-CM | POA: Diagnosis not present

## 2017-08-30 DIAGNOSIS — E1165 Type 2 diabetes mellitus with hyperglycemia: Secondary | ICD-10-CM | POA: Diagnosis not present

## 2017-09-02 DIAGNOSIS — E1140 Type 2 diabetes mellitus with diabetic neuropathy, unspecified: Secondary | ICD-10-CM | POA: Diagnosis not present

## 2017-09-02 DIAGNOSIS — E1165 Type 2 diabetes mellitus with hyperglycemia: Secondary | ICD-10-CM | POA: Diagnosis not present

## 2017-09-02 DIAGNOSIS — L89620 Pressure ulcer of left heel, unstageable: Secondary | ICD-10-CM | POA: Diagnosis not present

## 2017-09-02 DIAGNOSIS — I110 Hypertensive heart disease with heart failure: Secondary | ICD-10-CM | POA: Diagnosis not present

## 2017-09-02 DIAGNOSIS — J449 Chronic obstructive pulmonary disease, unspecified: Secondary | ICD-10-CM | POA: Diagnosis not present

## 2017-09-02 DIAGNOSIS — I509 Heart failure, unspecified: Secondary | ICD-10-CM | POA: Diagnosis not present

## 2017-09-04 DIAGNOSIS — I110 Hypertensive heart disease with heart failure: Secondary | ICD-10-CM | POA: Diagnosis not present

## 2017-09-04 DIAGNOSIS — E1140 Type 2 diabetes mellitus with diabetic neuropathy, unspecified: Secondary | ICD-10-CM | POA: Diagnosis not present

## 2017-09-04 DIAGNOSIS — J449 Chronic obstructive pulmonary disease, unspecified: Secondary | ICD-10-CM | POA: Diagnosis not present

## 2017-09-04 DIAGNOSIS — I509 Heart failure, unspecified: Secondary | ICD-10-CM | POA: Diagnosis not present

## 2017-09-04 DIAGNOSIS — L89620 Pressure ulcer of left heel, unstageable: Secondary | ICD-10-CM | POA: Diagnosis not present

## 2017-09-04 DIAGNOSIS — E1165 Type 2 diabetes mellitus with hyperglycemia: Secondary | ICD-10-CM | POA: Diagnosis not present

## 2017-09-05 DIAGNOSIS — S9032XA Contusion of left foot, initial encounter: Secondary | ICD-10-CM | POA: Diagnosis not present

## 2017-09-05 DIAGNOSIS — Z9889 Other specified postprocedural states: Secondary | ICD-10-CM | POA: Diagnosis not present

## 2017-09-06 DIAGNOSIS — E1140 Type 2 diabetes mellitus with diabetic neuropathy, unspecified: Secondary | ICD-10-CM | POA: Diagnosis not present

## 2017-09-06 DIAGNOSIS — J449 Chronic obstructive pulmonary disease, unspecified: Secondary | ICD-10-CM | POA: Diagnosis not present

## 2017-09-06 DIAGNOSIS — E1165 Type 2 diabetes mellitus with hyperglycemia: Secondary | ICD-10-CM | POA: Diagnosis not present

## 2017-09-06 DIAGNOSIS — I509 Heart failure, unspecified: Secondary | ICD-10-CM | POA: Diagnosis not present

## 2017-09-06 DIAGNOSIS — L89620 Pressure ulcer of left heel, unstageable: Secondary | ICD-10-CM | POA: Diagnosis not present

## 2017-09-06 DIAGNOSIS — I110 Hypertensive heart disease with heart failure: Secondary | ICD-10-CM | POA: Diagnosis not present

## 2017-09-09 DIAGNOSIS — S91302A Unspecified open wound, left foot, initial encounter: Secondary | ICD-10-CM | POA: Diagnosis not present

## 2017-09-11 DIAGNOSIS — I110 Hypertensive heart disease with heart failure: Secondary | ICD-10-CM | POA: Diagnosis not present

## 2017-09-11 DIAGNOSIS — Z6827 Body mass index (BMI) 27.0-27.9, adult: Secondary | ICD-10-CM | POA: Diagnosis not present

## 2017-09-11 DIAGNOSIS — E1140 Type 2 diabetes mellitus with diabetic neuropathy, unspecified: Secondary | ICD-10-CM | POA: Diagnosis not present

## 2017-09-11 DIAGNOSIS — J449 Chronic obstructive pulmonary disease, unspecified: Secondary | ICD-10-CM | POA: Diagnosis not present

## 2017-09-11 DIAGNOSIS — L89620 Pressure ulcer of left heel, unstageable: Secondary | ICD-10-CM | POA: Diagnosis not present

## 2017-09-11 DIAGNOSIS — I509 Heart failure, unspecified: Secondary | ICD-10-CM | POA: Diagnosis not present

## 2017-09-11 DIAGNOSIS — I739 Peripheral vascular disease, unspecified: Secondary | ICD-10-CM | POA: Diagnosis not present

## 2017-09-11 DIAGNOSIS — E1142 Type 2 diabetes mellitus with diabetic polyneuropathy: Secondary | ICD-10-CM | POA: Diagnosis not present

## 2017-09-11 DIAGNOSIS — I10 Essential (primary) hypertension: Secondary | ICD-10-CM | POA: Diagnosis not present

## 2017-09-11 DIAGNOSIS — Z299 Encounter for prophylactic measures, unspecified: Secondary | ICD-10-CM | POA: Diagnosis not present

## 2017-09-11 DIAGNOSIS — E1165 Type 2 diabetes mellitus with hyperglycemia: Secondary | ICD-10-CM | POA: Diagnosis not present

## 2017-09-13 DIAGNOSIS — I110 Hypertensive heart disease with heart failure: Secondary | ICD-10-CM | POA: Diagnosis not present

## 2017-09-13 DIAGNOSIS — E1165 Type 2 diabetes mellitus with hyperglycemia: Secondary | ICD-10-CM | POA: Diagnosis not present

## 2017-09-13 DIAGNOSIS — I509 Heart failure, unspecified: Secondary | ICD-10-CM | POA: Diagnosis not present

## 2017-09-13 DIAGNOSIS — L89620 Pressure ulcer of left heel, unstageable: Secondary | ICD-10-CM | POA: Diagnosis not present

## 2017-09-13 DIAGNOSIS — E1140 Type 2 diabetes mellitus with diabetic neuropathy, unspecified: Secondary | ICD-10-CM | POA: Diagnosis not present

## 2017-09-13 DIAGNOSIS — J449 Chronic obstructive pulmonary disease, unspecified: Secondary | ICD-10-CM | POA: Diagnosis not present

## 2017-09-17 DIAGNOSIS — I110 Hypertensive heart disease with heart failure: Secondary | ICD-10-CM | POA: Diagnosis not present

## 2017-09-17 DIAGNOSIS — I509 Heart failure, unspecified: Secondary | ICD-10-CM | POA: Diagnosis not present

## 2017-09-17 DIAGNOSIS — L89620 Pressure ulcer of left heel, unstageable: Secondary | ICD-10-CM | POA: Diagnosis not present

## 2017-09-17 DIAGNOSIS — E1165 Type 2 diabetes mellitus with hyperglycemia: Secondary | ICD-10-CM | POA: Diagnosis not present

## 2017-09-17 DIAGNOSIS — E1140 Type 2 diabetes mellitus with diabetic neuropathy, unspecified: Secondary | ICD-10-CM | POA: Diagnosis not present

## 2017-09-17 DIAGNOSIS — J449 Chronic obstructive pulmonary disease, unspecified: Secondary | ICD-10-CM | POA: Diagnosis not present

## 2017-09-20 DIAGNOSIS — E1165 Type 2 diabetes mellitus with hyperglycemia: Secondary | ICD-10-CM | POA: Diagnosis not present

## 2017-09-20 DIAGNOSIS — L89620 Pressure ulcer of left heel, unstageable: Secondary | ICD-10-CM | POA: Diagnosis not present

## 2017-09-20 DIAGNOSIS — I10 Essential (primary) hypertension: Secondary | ICD-10-CM | POA: Diagnosis not present

## 2017-09-20 DIAGNOSIS — E119 Type 2 diabetes mellitus without complications: Secondary | ICD-10-CM | POA: Diagnosis not present

## 2017-09-20 DIAGNOSIS — J449 Chronic obstructive pulmonary disease, unspecified: Secondary | ICD-10-CM | POA: Diagnosis not present

## 2017-09-20 DIAGNOSIS — I110 Hypertensive heart disease with heart failure: Secondary | ICD-10-CM | POA: Diagnosis not present

## 2017-09-20 DIAGNOSIS — I509 Heart failure, unspecified: Secondary | ICD-10-CM | POA: Diagnosis not present

## 2017-09-20 DIAGNOSIS — E1140 Type 2 diabetes mellitus with diabetic neuropathy, unspecified: Secondary | ICD-10-CM | POA: Diagnosis not present

## 2017-09-24 DIAGNOSIS — I110 Hypertensive heart disease with heart failure: Secondary | ICD-10-CM | POA: Diagnosis not present

## 2017-09-24 DIAGNOSIS — I509 Heart failure, unspecified: Secondary | ICD-10-CM | POA: Diagnosis not present

## 2017-09-24 DIAGNOSIS — L89620 Pressure ulcer of left heel, unstageable: Secondary | ICD-10-CM | POA: Diagnosis not present

## 2017-09-24 DIAGNOSIS — E1165 Type 2 diabetes mellitus with hyperglycemia: Secondary | ICD-10-CM | POA: Diagnosis not present

## 2017-09-24 DIAGNOSIS — J449 Chronic obstructive pulmonary disease, unspecified: Secondary | ICD-10-CM | POA: Diagnosis not present

## 2017-09-24 DIAGNOSIS — E1140 Type 2 diabetes mellitus with diabetic neuropathy, unspecified: Secondary | ICD-10-CM | POA: Diagnosis not present

## 2017-09-26 DIAGNOSIS — Z872 Personal history of diseases of the skin and subcutaneous tissue: Secondary | ICD-10-CM | POA: Diagnosis not present

## 2017-09-26 DIAGNOSIS — L97419 Non-pressure chronic ulcer of right heel and midfoot with unspecified severity: Secondary | ICD-10-CM | POA: Diagnosis not present

## 2017-09-26 DIAGNOSIS — Z09 Encounter for follow-up examination after completed treatment for conditions other than malignant neoplasm: Secondary | ICD-10-CM | POA: Diagnosis not present

## 2017-09-27 DIAGNOSIS — I509 Heart failure, unspecified: Secondary | ICD-10-CM | POA: Diagnosis not present

## 2017-09-27 DIAGNOSIS — L89620 Pressure ulcer of left heel, unstageable: Secondary | ICD-10-CM | POA: Diagnosis not present

## 2017-09-27 DIAGNOSIS — I110 Hypertensive heart disease with heart failure: Secondary | ICD-10-CM | POA: Diagnosis not present

## 2017-09-27 DIAGNOSIS — E1140 Type 2 diabetes mellitus with diabetic neuropathy, unspecified: Secondary | ICD-10-CM | POA: Diagnosis not present

## 2017-09-27 DIAGNOSIS — E1165 Type 2 diabetes mellitus with hyperglycemia: Secondary | ICD-10-CM | POA: Diagnosis not present

## 2017-09-27 DIAGNOSIS — J449 Chronic obstructive pulmonary disease, unspecified: Secondary | ICD-10-CM | POA: Diagnosis not present

## 2017-10-01 DIAGNOSIS — I110 Hypertensive heart disease with heart failure: Secondary | ICD-10-CM | POA: Diagnosis not present

## 2017-10-01 DIAGNOSIS — I509 Heart failure, unspecified: Secondary | ICD-10-CM | POA: Diagnosis not present

## 2017-10-01 DIAGNOSIS — J449 Chronic obstructive pulmonary disease, unspecified: Secondary | ICD-10-CM | POA: Diagnosis not present

## 2017-10-01 DIAGNOSIS — L89620 Pressure ulcer of left heel, unstageable: Secondary | ICD-10-CM | POA: Diagnosis not present

## 2017-10-01 DIAGNOSIS — E1165 Type 2 diabetes mellitus with hyperglycemia: Secondary | ICD-10-CM | POA: Diagnosis not present

## 2017-10-01 DIAGNOSIS — E1140 Type 2 diabetes mellitus with diabetic neuropathy, unspecified: Secondary | ICD-10-CM | POA: Diagnosis not present

## 2017-10-08 DIAGNOSIS — E1140 Type 2 diabetes mellitus with diabetic neuropathy, unspecified: Secondary | ICD-10-CM | POA: Diagnosis not present

## 2017-10-08 DIAGNOSIS — I509 Heart failure, unspecified: Secondary | ICD-10-CM | POA: Diagnosis not present

## 2017-10-08 DIAGNOSIS — I110 Hypertensive heart disease with heart failure: Secondary | ICD-10-CM | POA: Diagnosis not present

## 2017-10-08 DIAGNOSIS — L89620 Pressure ulcer of left heel, unstageable: Secondary | ICD-10-CM | POA: Diagnosis not present

## 2017-10-08 DIAGNOSIS — E1165 Type 2 diabetes mellitus with hyperglycemia: Secondary | ICD-10-CM | POA: Diagnosis not present

## 2017-10-08 DIAGNOSIS — J449 Chronic obstructive pulmonary disease, unspecified: Secondary | ICD-10-CM | POA: Diagnosis not present

## 2017-10-16 DIAGNOSIS — B351 Tinea unguium: Secondary | ICD-10-CM | POA: Diagnosis not present

## 2017-10-16 DIAGNOSIS — E1142 Type 2 diabetes mellitus with diabetic polyneuropathy: Secondary | ICD-10-CM | POA: Diagnosis not present

## 2017-10-16 DIAGNOSIS — L84 Corns and callosities: Secondary | ICD-10-CM | POA: Diagnosis not present

## 2017-10-16 DIAGNOSIS — M79676 Pain in unspecified toe(s): Secondary | ICD-10-CM | POA: Diagnosis not present

## 2017-11-18 DIAGNOSIS — I10 Essential (primary) hypertension: Secondary | ICD-10-CM | POA: Diagnosis not present

## 2017-11-18 DIAGNOSIS — E119 Type 2 diabetes mellitus without complications: Secondary | ICD-10-CM | POA: Diagnosis not present

## 2017-12-13 DIAGNOSIS — E119 Type 2 diabetes mellitus without complications: Secondary | ICD-10-CM | POA: Diagnosis not present

## 2017-12-13 DIAGNOSIS — I10 Essential (primary) hypertension: Secondary | ICD-10-CM | POA: Diagnosis not present

## 2017-12-23 DIAGNOSIS — E1165 Type 2 diabetes mellitus with hyperglycemia: Secondary | ICD-10-CM | POA: Diagnosis not present

## 2017-12-23 DIAGNOSIS — R42 Dizziness and giddiness: Secondary | ICD-10-CM | POA: Diagnosis not present

## 2017-12-23 DIAGNOSIS — F0280 Dementia in other diseases classified elsewhere without behavioral disturbance: Secondary | ICD-10-CM | POA: Diagnosis not present

## 2017-12-23 DIAGNOSIS — G309 Alzheimer's disease, unspecified: Secondary | ICD-10-CM | POA: Diagnosis not present

## 2017-12-23 DIAGNOSIS — Z299 Encounter for prophylactic measures, unspecified: Secondary | ICD-10-CM | POA: Diagnosis not present

## 2017-12-23 DIAGNOSIS — Z6827 Body mass index (BMI) 27.0-27.9, adult: Secondary | ICD-10-CM | POA: Diagnosis not present

## 2017-12-24 DIAGNOSIS — H81393 Other peripheral vertigo, bilateral: Secondary | ICD-10-CM | POA: Diagnosis not present

## 2017-12-24 DIAGNOSIS — E1159 Type 2 diabetes mellitus with other circulatory complications: Secondary | ICD-10-CM | POA: Diagnosis not present

## 2017-12-24 DIAGNOSIS — E1140 Type 2 diabetes mellitus with diabetic neuropathy, unspecified: Secondary | ICD-10-CM | POA: Diagnosis not present

## 2017-12-25 DIAGNOSIS — L97511 Non-pressure chronic ulcer of other part of right foot limited to breakdown of skin: Secondary | ICD-10-CM | POA: Diagnosis not present

## 2017-12-26 DIAGNOSIS — I8312 Varicose veins of left lower extremity with inflammation: Secondary | ICD-10-CM | POA: Diagnosis not present

## 2017-12-26 DIAGNOSIS — I8311 Varicose veins of right lower extremity with inflammation: Secondary | ICD-10-CM | POA: Diagnosis not present

## 2017-12-26 DIAGNOSIS — L821 Other seborrheic keratosis: Secondary | ICD-10-CM | POA: Diagnosis not present

## 2017-12-26 DIAGNOSIS — L570 Actinic keratosis: Secondary | ICD-10-CM | POA: Diagnosis not present

## 2017-12-26 DIAGNOSIS — L2084 Intrinsic (allergic) eczema: Secondary | ICD-10-CM | POA: Diagnosis not present

## 2017-12-26 DIAGNOSIS — D1801 Hemangioma of skin and subcutaneous tissue: Secondary | ICD-10-CM | POA: Diagnosis not present

## 2017-12-26 DIAGNOSIS — L7211 Pilar cyst: Secondary | ICD-10-CM | POA: Diagnosis not present

## 2017-12-26 DIAGNOSIS — D692 Other nonthrombocytopenic purpura: Secondary | ICD-10-CM | POA: Diagnosis not present

## 2017-12-31 DIAGNOSIS — Z1211 Encounter for screening for malignant neoplasm of colon: Secondary | ICD-10-CM | POA: Diagnosis not present

## 2017-12-31 DIAGNOSIS — Z299 Encounter for prophylactic measures, unspecified: Secondary | ICD-10-CM | POA: Diagnosis not present

## 2017-12-31 DIAGNOSIS — F0280 Dementia in other diseases classified elsewhere without behavioral disturbance: Secondary | ICD-10-CM | POA: Diagnosis not present

## 2017-12-31 DIAGNOSIS — Z6829 Body mass index (BMI) 29.0-29.9, adult: Secondary | ICD-10-CM | POA: Diagnosis not present

## 2017-12-31 DIAGNOSIS — I10 Essential (primary) hypertension: Secondary | ICD-10-CM | POA: Diagnosis not present

## 2017-12-31 DIAGNOSIS — Z0000 Encounter for general adult medical examination without abnormal findings: Secondary | ICD-10-CM | POA: Diagnosis not present

## 2017-12-31 DIAGNOSIS — F17210 Nicotine dependence, cigarettes, uncomplicated: Secondary | ICD-10-CM | POA: Diagnosis not present

## 2017-12-31 DIAGNOSIS — Z7189 Other specified counseling: Secondary | ICD-10-CM | POA: Diagnosis not present

## 2017-12-31 DIAGNOSIS — R5383 Other fatigue: Secondary | ICD-10-CM | POA: Diagnosis not present

## 2017-12-31 DIAGNOSIS — Z1339 Encounter for screening examination for other mental health and behavioral disorders: Secondary | ICD-10-CM | POA: Diagnosis not present

## 2017-12-31 DIAGNOSIS — G309 Alzheimer's disease, unspecified: Secondary | ICD-10-CM | POA: Diagnosis not present

## 2017-12-31 DIAGNOSIS — Z1331 Encounter for screening for depression: Secondary | ICD-10-CM | POA: Diagnosis not present

## 2018-01-01 DIAGNOSIS — Z125 Encounter for screening for malignant neoplasm of prostate: Secondary | ICD-10-CM | POA: Diagnosis not present

## 2018-01-01 DIAGNOSIS — Z79899 Other long term (current) drug therapy: Secondary | ICD-10-CM | POA: Diagnosis not present

## 2018-01-01 DIAGNOSIS — I10 Essential (primary) hypertension: Secondary | ICD-10-CM | POA: Diagnosis not present

## 2018-01-01 DIAGNOSIS — F419 Anxiety disorder, unspecified: Secondary | ICD-10-CM | POA: Diagnosis not present

## 2018-01-01 DIAGNOSIS — R5383 Other fatigue: Secondary | ICD-10-CM | POA: Diagnosis not present

## 2018-01-08 DIAGNOSIS — L97521 Non-pressure chronic ulcer of other part of left foot limited to breakdown of skin: Secondary | ICD-10-CM | POA: Diagnosis not present

## 2018-01-15 DIAGNOSIS — E559 Vitamin D deficiency, unspecified: Secondary | ICD-10-CM | POA: Diagnosis not present

## 2018-01-15 DIAGNOSIS — R42 Dizziness and giddiness: Secondary | ICD-10-CM | POA: Diagnosis not present

## 2018-01-15 DIAGNOSIS — E538 Deficiency of other specified B group vitamins: Secondary | ICD-10-CM | POA: Diagnosis not present

## 2018-01-15 DIAGNOSIS — F0390 Unspecified dementia without behavioral disturbance: Secondary | ICD-10-CM | POA: Diagnosis not present

## 2018-01-15 DIAGNOSIS — Z299 Encounter for prophylactic measures, unspecified: Secondary | ICD-10-CM | POA: Diagnosis not present

## 2018-01-15 DIAGNOSIS — Z6828 Body mass index (BMI) 28.0-28.9, adult: Secondary | ICD-10-CM | POA: Diagnosis not present

## 2018-01-15 DIAGNOSIS — I10 Essential (primary) hypertension: Secondary | ICD-10-CM | POA: Diagnosis not present

## 2018-01-27 ENCOUNTER — Encounter: Payer: Self-pay | Admitting: Vascular Surgery

## 2018-01-27 ENCOUNTER — Ambulatory Visit (INDEPENDENT_AMBULATORY_CARE_PROVIDER_SITE_OTHER): Payer: Medicare Other | Admitting: Vascular Surgery

## 2018-01-27 VITALS — BP 150/71 | HR 72 | Temp 97.7°F | Resp 16 | Ht 68.0 in | Wt 186.0 lb

## 2018-01-27 DIAGNOSIS — I739 Peripheral vascular disease, unspecified: Secondary | ICD-10-CM

## 2018-01-27 NOTE — Progress Notes (Signed)
[facility]  Patient name: Austin Peterson MRN: [id number] DOB: Apr 08, 1926 Sex: male   Seen today in our [place] office  REASON FOR CONSULT: Evaluation of lower extremity arterial flow  HPI: Austin Peterson is a 84 y.o. male, who is here today for evaluation of lower extremity arterial flow.  He is here today with his son and daughter.  He has a history of a heel fissure on the left which eventually healed and now has an ulceration over the tip of his left great toe.  He is undergone noninvasive studies and I have these for review as well.  He does not walk to the level of claudication.  He does spend a great deal of time in either recliner or in bed.  He is walking today with a rolling walker.  History reviewed. No pertinent past medical history.  History reviewed. No pertinent family history.  SOCIAL HISTORY: Social History   Socioeconomic History  . Marital status: Widowed    Spouse name: Not on file  . Number of children: Not on file  . Years of education: Not on file  . Highest education level: Not on file  Occupational History  . Not on file  Social Needs  . Financial resource strain: Not on file  . Food insecurity:    Worry: Not on file    Inability: Not on file  . Transportation needs:    Medical: Not on file    Non-medical: Not on file  Tobacco Use  . Smoking status: Never Smoker  . Smokeless tobacco: Never Used  Substance and Sexual Activity  . Alcohol use: Not Currently  . Drug use: Never  . Sexual activity: Not on file  Lifestyle  . Physical activity:    Days per week: Not on file    Minutes per session: Not on file  . Stress: Not on file  Relationships  . Social connections:    Talks on phone: Not on file    Gets together: Not on file    Attends religious service: Not on file    Active member of club or organization: Not on file    Attends meetings of clubs or organizations: Not on  file    Relationship status: Not on file  . Intimate partner violence:    Fear of current or ex partner: Not on file    Emotionally abused: Not on file    Physically abused: Not on file    Forced sexual activity: Not on file  Other Topics Concern  . Not on file  Social History Narrative  . Not on file    Allergies not on file  No current outpatient medications on file.   No current facility-administered medications for this visit.     REVIEW OF SYSTEMS:  [X]  denotes positive finding, [ ]  denotes negative finding Cardiac  Comments:  Chest pain or chest pressure:    Shortness of breath upon exertion: x   Short of breath when lying flat:    Irregular heart rhythm:        Vascular    Pain in calf, thigh, or hip brought on by ambulation: x   Pain in feet at night that wakes you up from your sleep:  x   Blood clot in your veins:    Leg swelling:         Pulmonary    Oxygen at home:    Productive cough:  Wheezing:         Neurologic    Sudden weakness in arms or legs:     Sudden numbness in arms or legs:     Sudden onset of difficulty speaking or slurred speech:    Temporary loss of vision in one eye:     Problems with dizziness:  x       Gastrointestinal    Blood in stool:     Vomited blood:         Genitourinary    Burning when urinating:     Blood in urine:        Psychiatric    Major depression:         Hematologic    Bleeding problems:    Problems with blood clotting too easily:        Skin    Rashes or ulcers:        Constitutional    Fever or chills:      PHYSICAL EXAM: Vitals:   01/27/18 1543  BP: (!) 150/71  Pulse: 72  Resp: 16  Temp: 97.7 F (36.5 C)  TempSrc: Oral  Weight: 186 lb (84.4 kg)  Height: 5\' 8"  (1.727 m)    GENERAL: The patient is a well-nourished male, in no acute distress. The vital signs are documented above. CARDIOVASCULAR: Really palpable popliteal pulses bilaterally.  I do not palpate pedal pulses bilaterally.   He has strong biphasic posterior tibial signal with hand-held Doppler and biphasic peroneal and biphasic to monophasic dorsalis pedis signal on the left PULMONARY: There is good air exchange  ABDOMEN: Soft and non-tender  MUSCULOSKELETAL: There are no major deformities or cyanosis. NEUROLOGIC: No focal weakness or paresthesias are detected. SKIN: He has some tenderness over his left heel but no open ulceration.  He does have very small 3 mm eschar over the tip of his left great toe PSYCHIATRIC: The patient has a normal affect.  DATA:  Ankle arm index on the left is 0.7 and on the right is 0.8  MEDICAL ISSUES: Had long discussion with the patient and his family present.  I explained that he does have normal flow to the level of the popliteal arteries bilaterally and noninvasive studies to suggest some tibial disease.  I feel that he has a high likelihood of having adequate flow to heal his ulceration although this may be delayed.  Explained the difficulty in treating the distal tibial disease.  As long as he is not having any progression of tissue loss I would recommend continued local wound care which is currently being done.  If he has any progression, would proceed with arteriography or evaluation.  They are comfortable with this plan and will see me again on an as-needed basis   Rosetta Posner, MD [facility] Tel [phone] Pager [phone]

## 2018-01-29 ENCOUNTER — Encounter: Payer: Self-pay | Admitting: Internal Medicine

## 2018-01-29 DIAGNOSIS — E119 Type 2 diabetes mellitus without complications: Secondary | ICD-10-CM | POA: Diagnosis not present

## 2018-01-29 DIAGNOSIS — I10 Essential (primary) hypertension: Secondary | ICD-10-CM | POA: Diagnosis not present

## 2018-01-31 DIAGNOSIS — Z23 Encounter for immunization: Secondary | ICD-10-CM | POA: Diagnosis not present

## 2018-02-05 DIAGNOSIS — H43813 Vitreous degeneration, bilateral: Secondary | ICD-10-CM | POA: Diagnosis not present

## 2018-02-10 DIAGNOSIS — E1142 Type 2 diabetes mellitus with diabetic polyneuropathy: Secondary | ICD-10-CM | POA: Diagnosis not present

## 2018-02-10 DIAGNOSIS — Z0189 Encounter for other specified special examinations: Secondary | ICD-10-CM | POA: Diagnosis not present

## 2018-02-10 DIAGNOSIS — L97521 Non-pressure chronic ulcer of other part of left foot limited to breakdown of skin: Secondary | ICD-10-CM | POA: Diagnosis not present

## 2018-02-12 DIAGNOSIS — R195 Other fecal abnormalities: Secondary | ICD-10-CM | POA: Diagnosis not present

## 2018-03-05 DIAGNOSIS — E1142 Type 2 diabetes mellitus with diabetic polyneuropathy: Secondary | ICD-10-CM | POA: Diagnosis not present

## 2018-03-05 DIAGNOSIS — L97521 Non-pressure chronic ulcer of other part of left foot limited to breakdown of skin: Secondary | ICD-10-CM | POA: Diagnosis not present

## 2018-03-26 DIAGNOSIS — L97521 Non-pressure chronic ulcer of other part of left foot limited to breakdown of skin: Secondary | ICD-10-CM | POA: Diagnosis not present

## 2018-03-31 DIAGNOSIS — I10 Essential (primary) hypertension: Secondary | ICD-10-CM | POA: Diagnosis not present

## 2018-03-31 DIAGNOSIS — E119 Type 2 diabetes mellitus without complications: Secondary | ICD-10-CM | POA: Diagnosis not present

## 2018-04-07 DIAGNOSIS — E1142 Type 2 diabetes mellitus with diabetic polyneuropathy: Secondary | ICD-10-CM | POA: Diagnosis not present

## 2018-04-07 DIAGNOSIS — E1165 Type 2 diabetes mellitus with hyperglycemia: Secondary | ICD-10-CM | POA: Diagnosis not present

## 2018-04-07 DIAGNOSIS — Z299 Encounter for prophylactic measures, unspecified: Secondary | ICD-10-CM | POA: Diagnosis not present

## 2018-04-07 DIAGNOSIS — I10 Essential (primary) hypertension: Secondary | ICD-10-CM | POA: Diagnosis not present

## 2018-04-07 DIAGNOSIS — F0390 Unspecified dementia without behavioral disturbance: Secondary | ICD-10-CM | POA: Diagnosis not present

## 2018-04-07 DIAGNOSIS — I509 Heart failure, unspecified: Secondary | ICD-10-CM | POA: Diagnosis not present

## 2018-04-07 DIAGNOSIS — Z6828 Body mass index (BMI) 28.0-28.9, adult: Secondary | ICD-10-CM | POA: Diagnosis not present

## 2018-04-29 DIAGNOSIS — E119 Type 2 diabetes mellitus without complications: Secondary | ICD-10-CM | POA: Diagnosis not present

## 2018-04-29 DIAGNOSIS — I10 Essential (primary) hypertension: Secondary | ICD-10-CM | POA: Diagnosis not present

## 2018-06-04 DIAGNOSIS — L97521 Non-pressure chronic ulcer of other part of left foot limited to breakdown of skin: Secondary | ICD-10-CM | POA: Diagnosis not present

## 2018-07-02 DIAGNOSIS — L97421 Non-pressure chronic ulcer of left heel and midfoot limited to breakdown of skin: Secondary | ICD-10-CM | POA: Diagnosis not present

## 2018-07-11 DIAGNOSIS — Z299 Encounter for prophylactic measures, unspecified: Secondary | ICD-10-CM | POA: Diagnosis not present

## 2018-07-11 DIAGNOSIS — I509 Heart failure, unspecified: Secondary | ICD-10-CM | POA: Diagnosis not present

## 2018-07-11 DIAGNOSIS — Z6828 Body mass index (BMI) 28.0-28.9, adult: Secondary | ICD-10-CM | POA: Diagnosis not present

## 2018-07-11 DIAGNOSIS — I10 Essential (primary) hypertension: Secondary | ICD-10-CM | POA: Diagnosis not present

## 2018-07-11 DIAGNOSIS — E1142 Type 2 diabetes mellitus with diabetic polyneuropathy: Secondary | ICD-10-CM | POA: Diagnosis not present

## 2018-07-11 DIAGNOSIS — E1165 Type 2 diabetes mellitus with hyperglycemia: Secondary | ICD-10-CM | POA: Diagnosis not present

## 2018-07-11 DIAGNOSIS — G309 Alzheimer's disease, unspecified: Secondary | ICD-10-CM | POA: Diagnosis not present

## 2018-07-17 DIAGNOSIS — E119 Type 2 diabetes mellitus without complications: Secondary | ICD-10-CM | POA: Diagnosis not present

## 2018-07-17 DIAGNOSIS — I10 Essential (primary) hypertension: Secondary | ICD-10-CM | POA: Diagnosis not present

## 2018-07-23 DIAGNOSIS — L97421 Non-pressure chronic ulcer of left heel and midfoot limited to breakdown of skin: Secondary | ICD-10-CM | POA: Diagnosis not present

## 2018-07-24 DIAGNOSIS — F0390 Unspecified dementia without behavioral disturbance: Secondary | ICD-10-CM | POA: Diagnosis not present

## 2018-07-24 DIAGNOSIS — I10 Essential (primary) hypertension: Secondary | ICD-10-CM | POA: Diagnosis not present

## 2018-07-24 DIAGNOSIS — Z6829 Body mass index (BMI) 29.0-29.9, adult: Secondary | ICD-10-CM | POA: Diagnosis not present

## 2018-07-24 DIAGNOSIS — E1165 Type 2 diabetes mellitus with hyperglycemia: Secondary | ICD-10-CM | POA: Diagnosis not present

## 2018-07-24 DIAGNOSIS — E1142 Type 2 diabetes mellitus with diabetic polyneuropathy: Secondary | ICD-10-CM | POA: Diagnosis not present

## 2018-07-24 DIAGNOSIS — Z299 Encounter for prophylactic measures, unspecified: Secondary | ICD-10-CM | POA: Diagnosis not present

## 2018-07-24 DIAGNOSIS — T148XXA Other injury of unspecified body region, initial encounter: Secondary | ICD-10-CM | POA: Diagnosis not present

## 2018-08-12 DIAGNOSIS — I509 Heart failure, unspecified: Secondary | ICD-10-CM | POA: Diagnosis not present

## 2018-08-12 DIAGNOSIS — I10 Essential (primary) hypertension: Secondary | ICD-10-CM | POA: Diagnosis not present

## 2018-08-12 DIAGNOSIS — E1165 Type 2 diabetes mellitus with hyperglycemia: Secondary | ICD-10-CM | POA: Diagnosis not present

## 2018-08-12 DIAGNOSIS — Z299 Encounter for prophylactic measures, unspecified: Secondary | ICD-10-CM | POA: Diagnosis not present

## 2018-08-12 DIAGNOSIS — Z6828 Body mass index (BMI) 28.0-28.9, adult: Secondary | ICD-10-CM | POA: Diagnosis not present

## 2018-08-12 DIAGNOSIS — R410 Disorientation, unspecified: Secondary | ICD-10-CM | POA: Diagnosis not present

## 2018-08-12 DIAGNOSIS — G309 Alzheimer's disease, unspecified: Secondary | ICD-10-CM | POA: Diagnosis not present

## 2018-08-12 DIAGNOSIS — F0280 Dementia in other diseases classified elsewhere without behavioral disturbance: Secondary | ICD-10-CM | POA: Diagnosis not present

## 2018-08-13 DIAGNOSIS — L97421 Non-pressure chronic ulcer of left heel and midfoot limited to breakdown of skin: Secondary | ICD-10-CM | POA: Diagnosis not present

## 2018-09-16 DIAGNOSIS — I10 Essential (primary) hypertension: Secondary | ICD-10-CM | POA: Diagnosis not present

## 2018-09-16 DIAGNOSIS — E119 Type 2 diabetes mellitus without complications: Secondary | ICD-10-CM | POA: Diagnosis not present

## 2018-09-17 DIAGNOSIS — L97521 Non-pressure chronic ulcer of other part of left foot limited to breakdown of skin: Secondary | ICD-10-CM | POA: Diagnosis not present

## 2018-10-08 DIAGNOSIS — L97521 Non-pressure chronic ulcer of other part of left foot limited to breakdown of skin: Secondary | ICD-10-CM | POA: Diagnosis not present

## 2018-10-17 DIAGNOSIS — Z6827 Body mass index (BMI) 27.0-27.9, adult: Secondary | ICD-10-CM | POA: Diagnosis not present

## 2018-10-17 DIAGNOSIS — J449 Chronic obstructive pulmonary disease, unspecified: Secondary | ICD-10-CM | POA: Diagnosis not present

## 2018-10-17 DIAGNOSIS — E1165 Type 2 diabetes mellitus with hyperglycemia: Secondary | ICD-10-CM | POA: Diagnosis not present

## 2018-10-17 DIAGNOSIS — F0280 Dementia in other diseases classified elsewhere without behavioral disturbance: Secondary | ICD-10-CM | POA: Diagnosis not present

## 2018-10-17 DIAGNOSIS — G309 Alzheimer's disease, unspecified: Secondary | ICD-10-CM | POA: Diagnosis not present

## 2018-10-17 DIAGNOSIS — Z299 Encounter for prophylactic measures, unspecified: Secondary | ICD-10-CM | POA: Diagnosis not present

## 2018-10-17 DIAGNOSIS — I10 Essential (primary) hypertension: Secondary | ICD-10-CM | POA: Diagnosis not present

## 2018-10-22 DIAGNOSIS — E119 Type 2 diabetes mellitus without complications: Secondary | ICD-10-CM | POA: Diagnosis not present

## 2018-10-22 DIAGNOSIS — I10 Essential (primary) hypertension: Secondary | ICD-10-CM | POA: Diagnosis not present

## 2018-11-15 DIAGNOSIS — Z794 Long term (current) use of insulin: Secondary | ICD-10-CM | POA: Diagnosis not present

## 2018-11-15 DIAGNOSIS — R748 Abnormal levels of other serum enzymes: Secondary | ICD-10-CM | POA: Diagnosis not present

## 2018-11-15 DIAGNOSIS — S0001XA Abrasion of scalp, initial encounter: Secondary | ICD-10-CM | POA: Diagnosis not present

## 2018-11-15 DIAGNOSIS — Z9181 History of falling: Secondary | ICD-10-CM | POA: Diagnosis not present

## 2018-11-15 DIAGNOSIS — I10 Essential (primary) hypertension: Secondary | ICD-10-CM | POA: Diagnosis not present

## 2018-11-15 DIAGNOSIS — Z7982 Long term (current) use of aspirin: Secondary | ICD-10-CM | POA: Diagnosis not present

## 2018-11-15 DIAGNOSIS — R7989 Other specified abnormal findings of blood chemistry: Secondary | ICD-10-CM | POA: Diagnosis not present

## 2018-11-15 DIAGNOSIS — R296 Repeated falls: Secondary | ICD-10-CM | POA: Diagnosis not present

## 2018-11-15 DIAGNOSIS — N2889 Other specified disorders of kidney and ureter: Secondary | ICD-10-CM | POA: Diagnosis not present

## 2018-11-15 DIAGNOSIS — Z79899 Other long term (current) drug therapy: Secondary | ICD-10-CM | POA: Diagnosis not present

## 2018-11-15 DIAGNOSIS — S50311A Abrasion of right elbow, initial encounter: Secondary | ICD-10-CM | POA: Diagnosis not present

## 2018-11-15 DIAGNOSIS — S40022A Contusion of left upper arm, initial encounter: Secondary | ICD-10-CM | POA: Diagnosis not present

## 2018-11-15 DIAGNOSIS — E7800 Pure hypercholesterolemia, unspecified: Secondary | ICD-10-CM | POA: Diagnosis not present

## 2018-11-15 DIAGNOSIS — E1165 Type 2 diabetes mellitus with hyperglycemia: Secondary | ICD-10-CM | POA: Diagnosis not present

## 2018-11-15 DIAGNOSIS — F0390 Unspecified dementia without behavioral disturbance: Secondary | ICD-10-CM | POA: Diagnosis not present

## 2018-11-15 DIAGNOSIS — R51 Headache: Secondary | ICD-10-CM | POA: Diagnosis not present

## 2018-11-15 DIAGNOSIS — Z87891 Personal history of nicotine dependence: Secondary | ICD-10-CM | POA: Diagnosis not present

## 2018-11-15 DIAGNOSIS — W19XXXA Unspecified fall, initial encounter: Secondary | ICD-10-CM | POA: Diagnosis not present

## 2018-11-15 DIAGNOSIS — S40021A Contusion of right upper arm, initial encounter: Secondary | ICD-10-CM | POA: Diagnosis not present

## 2018-11-15 DIAGNOSIS — Z23 Encounter for immunization: Secondary | ICD-10-CM | POA: Diagnosis not present

## 2018-11-15 DIAGNOSIS — M545 Low back pain: Secondary | ICD-10-CM | POA: Diagnosis not present

## 2018-11-25 DIAGNOSIS — I10 Essential (primary) hypertension: Secondary | ICD-10-CM | POA: Diagnosis not present

## 2018-12-22 DIAGNOSIS — I10 Essential (primary) hypertension: Secondary | ICD-10-CM | POA: Diagnosis not present

## 2018-12-22 DIAGNOSIS — E119 Type 2 diabetes mellitus without complications: Secondary | ICD-10-CM | POA: Diagnosis not present

## 2018-12-29 DIAGNOSIS — M79676 Pain in unspecified toe(s): Secondary | ICD-10-CM | POA: Diagnosis not present

## 2018-12-29 DIAGNOSIS — B351 Tinea unguium: Secondary | ICD-10-CM | POA: Diagnosis not present

## 2018-12-29 DIAGNOSIS — E1142 Type 2 diabetes mellitus with diabetic polyneuropathy: Secondary | ICD-10-CM | POA: Diagnosis not present

## 2018-12-29 DIAGNOSIS — L84 Corns and callosities: Secondary | ICD-10-CM | POA: Diagnosis not present

## 2019-01-02 DIAGNOSIS — Z7189 Other specified counseling: Secondary | ICD-10-CM | POA: Diagnosis not present

## 2019-01-02 DIAGNOSIS — Z79899 Other long term (current) drug therapy: Secondary | ICD-10-CM | POA: Diagnosis not present

## 2019-01-02 DIAGNOSIS — I509 Heart failure, unspecified: Secondary | ICD-10-CM | POA: Diagnosis not present

## 2019-01-02 DIAGNOSIS — Z0000 Encounter for general adult medical examination without abnormal findings: Secondary | ICD-10-CM | POA: Diagnosis not present

## 2019-01-02 DIAGNOSIS — E7800 Pure hypercholesterolemia, unspecified: Secondary | ICD-10-CM | POA: Diagnosis not present

## 2019-01-02 DIAGNOSIS — Z125 Encounter for screening for malignant neoplasm of prostate: Secondary | ICD-10-CM | POA: Diagnosis not present

## 2019-01-02 DIAGNOSIS — Z299 Encounter for prophylactic measures, unspecified: Secondary | ICD-10-CM | POA: Diagnosis not present

## 2019-01-02 DIAGNOSIS — E1142 Type 2 diabetes mellitus with diabetic polyneuropathy: Secondary | ICD-10-CM | POA: Diagnosis not present

## 2019-01-02 DIAGNOSIS — R5383 Other fatigue: Secondary | ICD-10-CM | POA: Diagnosis not present

## 2019-01-02 DIAGNOSIS — Z6826 Body mass index (BMI) 26.0-26.9, adult: Secondary | ICD-10-CM | POA: Diagnosis not present

## 2019-01-02 DIAGNOSIS — E1165 Type 2 diabetes mellitus with hyperglycemia: Secondary | ICD-10-CM | POA: Diagnosis not present

## 2019-01-02 DIAGNOSIS — I10 Essential (primary) hypertension: Secondary | ICD-10-CM | POA: Diagnosis not present

## 2019-01-02 DIAGNOSIS — J449 Chronic obstructive pulmonary disease, unspecified: Secondary | ICD-10-CM | POA: Diagnosis not present

## 2019-01-02 DIAGNOSIS — Z1331 Encounter for screening for depression: Secondary | ICD-10-CM | POA: Diagnosis not present

## 2019-01-02 DIAGNOSIS — Z1339 Encounter for screening examination for other mental health and behavioral disorders: Secondary | ICD-10-CM | POA: Diagnosis not present

## 2019-01-13 DIAGNOSIS — K625 Hemorrhage of anus and rectum: Secondary | ICD-10-CM | POA: Diagnosis not present

## 2019-01-30 DIAGNOSIS — E119 Type 2 diabetes mellitus without complications: Secondary | ICD-10-CM | POA: Diagnosis not present

## 2019-01-30 DIAGNOSIS — I10 Essential (primary) hypertension: Secondary | ICD-10-CM | POA: Diagnosis not present

## 2019-01-30 DIAGNOSIS — R4020 Unspecified coma: Secondary | ICD-10-CM | POA: Diagnosis not present

## 2019-01-30 DIAGNOSIS — I959 Hypotension, unspecified: Secondary | ICD-10-CM | POA: Diagnosis not present

## 2019-01-30 DIAGNOSIS — Z7982 Long term (current) use of aspirin: Secondary | ICD-10-CM | POA: Diagnosis not present

## 2019-01-30 DIAGNOSIS — Z79899 Other long term (current) drug therapy: Secondary | ICD-10-CM | POA: Diagnosis not present

## 2019-01-30 DIAGNOSIS — H538 Other visual disturbances: Secondary | ICD-10-CM | POA: Diagnosis not present

## 2019-01-30 DIAGNOSIS — R569 Unspecified convulsions: Secondary | ICD-10-CM | POA: Diagnosis not present

## 2019-01-30 DIAGNOSIS — Z87891 Personal history of nicotine dependence: Secondary | ICD-10-CM | POA: Diagnosis not present

## 2019-01-30 DIAGNOSIS — E7800 Pure hypercholesterolemia, unspecified: Secondary | ICD-10-CM | POA: Diagnosis not present

## 2019-01-30 DIAGNOSIS — Z7984 Long term (current) use of oral hypoglycemic drugs: Secondary | ICD-10-CM | POA: Diagnosis not present

## 2019-02-25 DIAGNOSIS — E860 Dehydration: Secondary | ICD-10-CM | POA: Diagnosis not present

## 2019-02-25 DIAGNOSIS — R404 Transient alteration of awareness: Secondary | ICD-10-CM | POA: Diagnosis not present

## 2019-02-25 DIAGNOSIS — F0390 Unspecified dementia without behavioral disturbance: Secondary | ICD-10-CM | POA: Diagnosis not present

## 2019-02-25 DIAGNOSIS — I10 Essential (primary) hypertension: Secondary | ICD-10-CM | POA: Diagnosis not present

## 2019-02-25 DIAGNOSIS — E11649 Type 2 diabetes mellitus with hypoglycemia without coma: Secondary | ICD-10-CM | POA: Diagnosis not present

## 2019-02-25 DIAGNOSIS — R627 Adult failure to thrive: Secondary | ICD-10-CM | POA: Diagnosis not present

## 2019-02-25 DIAGNOSIS — E161 Other hypoglycemia: Secondary | ICD-10-CM | POA: Diagnosis not present

## 2019-02-25 DIAGNOSIS — N400 Enlarged prostate without lower urinary tract symptoms: Secondary | ICD-10-CM | POA: Diagnosis not present

## 2019-02-25 DIAGNOSIS — E162 Hypoglycemia, unspecified: Secondary | ICD-10-CM | POA: Diagnosis not present

## 2019-02-25 DIAGNOSIS — R000 Tachycardia, unspecified: Secondary | ICD-10-CM | POA: Diagnosis not present

## 2019-02-25 DIAGNOSIS — K219 Gastro-esophageal reflux disease without esophagitis: Secondary | ICD-10-CM | POA: Diagnosis not present

## 2019-02-25 DIAGNOSIS — F329 Major depressive disorder, single episode, unspecified: Secondary | ICD-10-CM | POA: Diagnosis not present

## 2019-02-25 DIAGNOSIS — E785 Hyperlipidemia, unspecified: Secondary | ICD-10-CM | POA: Diagnosis not present

## 2019-02-26 DIAGNOSIS — Z9049 Acquired absence of other specified parts of digestive tract: Secondary | ICD-10-CM | POA: Diagnosis not present

## 2019-02-26 DIAGNOSIS — Z1152 Encounter for screening for COVID-19: Secondary | ICD-10-CM | POA: Diagnosis present

## 2019-02-26 DIAGNOSIS — Z7401 Bed confinement status: Secondary | ICD-10-CM | POA: Diagnosis not present

## 2019-02-26 DIAGNOSIS — K219 Gastro-esophageal reflux disease without esophagitis: Secondary | ICD-10-CM | POA: Diagnosis present

## 2019-02-26 DIAGNOSIS — E860 Dehydration: Secondary | ICD-10-CM | POA: Diagnosis present

## 2019-02-26 DIAGNOSIS — Z6825 Body mass index (BMI) 25.0-25.9, adult: Secondary | ICD-10-CM | POA: Diagnosis not present

## 2019-02-26 DIAGNOSIS — F329 Major depressive disorder, single episode, unspecified: Secondary | ICD-10-CM | POA: Diagnosis present

## 2019-02-26 DIAGNOSIS — Z9861 Coronary angioplasty status: Secondary | ICD-10-CM | POA: Diagnosis not present

## 2019-02-26 DIAGNOSIS — I10 Essential (primary) hypertension: Secondary | ICD-10-CM | POA: Diagnosis present

## 2019-02-26 DIAGNOSIS — E785 Hyperlipidemia, unspecified: Secondary | ICD-10-CM | POA: Diagnosis present

## 2019-02-26 DIAGNOSIS — M6281 Muscle weakness (generalized): Secondary | ICD-10-CM | POA: Diagnosis not present

## 2019-02-26 DIAGNOSIS — E11649 Type 2 diabetes mellitus with hypoglycemia without coma: Secondary | ICD-10-CM | POA: Diagnosis present

## 2019-02-26 DIAGNOSIS — F0390 Unspecified dementia without behavioral disturbance: Secondary | ICD-10-CM | POA: Diagnosis present

## 2019-02-26 DIAGNOSIS — J45909 Unspecified asthma, uncomplicated: Secondary | ICD-10-CM | POA: Diagnosis present

## 2019-02-26 DIAGNOSIS — R627 Adult failure to thrive: Secondary | ICD-10-CM | POA: Diagnosis not present

## 2019-02-26 DIAGNOSIS — Z7984 Long term (current) use of oral hypoglycemic drugs: Secondary | ICD-10-CM | POA: Diagnosis not present

## 2019-02-26 DIAGNOSIS — S37009D Unspecified injury of unspecified kidney, subsequent encounter: Secondary | ICD-10-CM | POA: Diagnosis not present

## 2019-02-26 DIAGNOSIS — F419 Anxiety disorder, unspecified: Secondary | ICD-10-CM | POA: Diagnosis not present

## 2019-02-26 DIAGNOSIS — E1165 Type 2 diabetes mellitus with hyperglycemia: Secondary | ICD-10-CM | POA: Diagnosis not present

## 2019-02-26 DIAGNOSIS — N400 Enlarged prostate without lower urinary tract symptoms: Secondary | ICD-10-CM | POA: Diagnosis present

## 2019-02-26 DIAGNOSIS — Z79899 Other long term (current) drug therapy: Secondary | ICD-10-CM | POA: Diagnosis not present

## 2019-02-28 DIAGNOSIS — Z8669 Personal history of other diseases of the nervous system and sense organs: Secondary | ICD-10-CM | POA: Diagnosis not present

## 2019-02-28 DIAGNOSIS — F419 Anxiety disorder, unspecified: Secondary | ICD-10-CM | POA: Diagnosis not present

## 2019-02-28 DIAGNOSIS — I10 Essential (primary) hypertension: Secondary | ICD-10-CM | POA: Diagnosis not present

## 2019-02-28 DIAGNOSIS — F0390 Unspecified dementia without behavioral disturbance: Secondary | ICD-10-CM | POA: Diagnosis not present

## 2019-02-28 DIAGNOSIS — I469 Cardiac arrest, cause unspecified: Secondary | ICD-10-CM | POA: Diagnosis not present

## 2019-02-28 DIAGNOSIS — S37009D Unspecified injury of unspecified kidney, subsequent encounter: Secondary | ICD-10-CM | POA: Diagnosis not present

## 2019-02-28 DIAGNOSIS — Z7401 Bed confinement status: Secondary | ICD-10-CM | POA: Diagnosis not present

## 2019-02-28 DIAGNOSIS — R5383 Other fatigue: Secondary | ICD-10-CM | POA: Diagnosis not present

## 2019-02-28 DIAGNOSIS — J188 Other pneumonia, unspecified organism: Secondary | ICD-10-CM | POA: Diagnosis not present

## 2019-02-28 DIAGNOSIS — E860 Dehydration: Secondary | ICD-10-CM | POA: Diagnosis not present

## 2019-02-28 DIAGNOSIS — E785 Hyperlipidemia, unspecified: Secondary | ICD-10-CM | POA: Diagnosis not present

## 2019-02-28 DIAGNOSIS — M6281 Muscle weakness (generalized): Secondary | ICD-10-CM | POA: Diagnosis not present

## 2019-02-28 DIAGNOSIS — I468 Cardiac arrest due to other underlying condition: Secondary | ICD-10-CM | POA: Diagnosis not present

## 2019-02-28 DIAGNOSIS — R0902 Hypoxemia: Secondary | ICD-10-CM | POA: Diagnosis not present

## 2019-02-28 DIAGNOSIS — R0989 Other specified symptoms and signs involving the circulatory and respiratory systems: Secondary | ICD-10-CM | POA: Diagnosis not present

## 2019-02-28 DIAGNOSIS — R0603 Acute respiratory distress: Secondary | ICD-10-CM | POA: Diagnosis not present

## 2019-02-28 DIAGNOSIS — R008 Other abnormalities of heart beat: Secondary | ICD-10-CM | POA: Diagnosis not present

## 2019-02-28 DIAGNOSIS — U071 COVID-19: Secondary | ICD-10-CM | POA: Diagnosis not present

## 2019-02-28 DIAGNOSIS — J9601 Acute respiratory failure with hypoxia: Secondary | ICD-10-CM | POA: Diagnosis not present

## 2019-02-28 DIAGNOSIS — Z886 Allergy status to analgesic agent status: Secondary | ICD-10-CM | POA: Diagnosis not present

## 2019-02-28 DIAGNOSIS — R627 Adult failure to thrive: Secondary | ICD-10-CM | POA: Diagnosis not present

## 2019-02-28 DIAGNOSIS — E1165 Type 2 diabetes mellitus with hyperglycemia: Secondary | ICD-10-CM | POA: Diagnosis not present

## 2019-02-28 DIAGNOSIS — K219 Gastro-esophageal reflux disease without esophagitis: Secondary | ICD-10-CM | POA: Diagnosis not present

## 2019-02-28 DIAGNOSIS — N400 Enlarged prostate without lower urinary tract symptoms: Secondary | ICD-10-CM | POA: Diagnosis not present

## 2019-02-28 DIAGNOSIS — E119 Type 2 diabetes mellitus without complications: Secondary | ICD-10-CM | POA: Diagnosis not present

## 2019-02-28 DIAGNOSIS — J45909 Unspecified asthma, uncomplicated: Secondary | ICD-10-CM | POA: Diagnosis not present

## 2019-03-03 DIAGNOSIS — E119 Type 2 diabetes mellitus without complications: Secondary | ICD-10-CM | POA: Diagnosis not present

## 2019-03-03 DIAGNOSIS — J9601 Acute respiratory failure with hypoxia: Secondary | ICD-10-CM | POA: Diagnosis not present

## 2019-03-03 DIAGNOSIS — F0390 Unspecified dementia without behavioral disturbance: Secondary | ICD-10-CM | POA: Diagnosis not present

## 2019-03-03 DIAGNOSIS — R0603 Acute respiratory distress: Secondary | ICD-10-CM | POA: Diagnosis not present

## 2019-03-03 DIAGNOSIS — Z8669 Personal history of other diseases of the nervous system and sense organs: Secondary | ICD-10-CM | POA: Diagnosis not present

## 2019-03-03 DIAGNOSIS — R0902 Hypoxemia: Secondary | ICD-10-CM | POA: Diagnosis not present

## 2019-03-03 DIAGNOSIS — U071 COVID-19: Secondary | ICD-10-CM | POA: Diagnosis not present

## 2019-03-03 DIAGNOSIS — R008 Other abnormalities of heart beat: Secondary | ICD-10-CM | POA: Diagnosis not present

## 2019-03-03 DIAGNOSIS — R0989 Other specified symptoms and signs involving the circulatory and respiratory systems: Secondary | ICD-10-CM | POA: Diagnosis not present

## 2019-03-03 DIAGNOSIS — I469 Cardiac arrest, cause unspecified: Secondary | ICD-10-CM | POA: Diagnosis not present

## 2019-03-03 DIAGNOSIS — J188 Other pneumonia, unspecified organism: Secondary | ICD-10-CM | POA: Diagnosis not present

## 2019-03-03 DIAGNOSIS — R5383 Other fatigue: Secondary | ICD-10-CM | POA: Diagnosis not present

## 2019-03-03 DIAGNOSIS — Z886 Allergy status to analgesic agent status: Secondary | ICD-10-CM | POA: Diagnosis not present

## 2019-03-03 DIAGNOSIS — I468 Cardiac arrest due to other underlying condition: Secondary | ICD-10-CM | POA: Diagnosis not present

## 2019-03-30 DEATH — deceased
